# Patient Record
Sex: Female | Born: 1950 | Race: White | Hispanic: No | Marital: Single | Smoking: Never smoker
Health system: Southern US, Community
[De-identification: ages and names within clinical notes are randomized; demographics above are authoritative.]

## PROBLEM LIST (undated history)

## (undated) DIAGNOSIS — I10 Essential (primary) hypertension: Secondary | ICD-10-CM

## (undated) DIAGNOSIS — F419 Anxiety disorder, unspecified: Secondary | ICD-10-CM

## (undated) DIAGNOSIS — F4310 Post-traumatic stress disorder, unspecified: Secondary | ICD-10-CM

## (undated) HISTORY — PX: OVARIAN CYST REMOVAL: SHX89

---

## 2016-03-28 ENCOUNTER — Emergency Department (HOSPITAL_COMMUNITY)
Admission: EM | Admit: 2016-03-28 | Discharge: 2016-03-30 | Disposition: A | Discharge status: Home / Self Care | Payer: BLUE CROSS/BLUE SHIELD | Attending: Emergency Medicine | Admitting: Emergency Medicine

## 2016-03-28 DIAGNOSIS — F419 Anxiety disorder, unspecified: Secondary | ICD-10-CM | POA: Diagnosis not present

## 2016-03-28 DIAGNOSIS — F4323 Adjustment disorder with mixed anxiety and depressed mood: Secondary | ICD-10-CM

## 2016-03-28 HISTORY — DX: Post-traumatic stress disorder, unspecified: F43.10

## 2016-03-28 HISTORY — DX: Anxiety disorder, unspecified: F41.9

## 2016-03-28 NOTE — ED Notes (Addendum)
Pt called EMS because her dog was sick. Pt told EMS that she has a new rx for Seroquel and has not been sleeping. EMS gave 5 mg of haldol and 2.5 pf versed. Pt was extremely worked up prior to medication. Pt is now resting in a bed in triage. Pt denies SI,HI, A/VH Pt is extremely worried about her dog during assessment. Pt is calm and cooperative.

## 2016-03-29 ENCOUNTER — Encounter (HOSPITAL_COMMUNITY): Payer: Self-pay | Admitting: Emergency Medicine

## 2016-03-29 LAB — CBC WITH DIFFERENTIAL/PLATELET
Basophils Absolute: 0 10*3/uL (ref 0.0–0.1)
Basophils Relative: 0 %
Eosinophils Absolute: 0.2 10*3/uL (ref 0.0–0.7)
Eosinophils Relative: 1 %
HCT: 37.5 % (ref 36.0–46.0)
Hemoglobin: 12.5 g/dL (ref 12.0–15.0)
Lymphocytes Relative: 39 %
Lymphs Abs: 4.3 10*3/uL — ABNORMAL HIGH (ref 0.7–4.0)
MCH: 29.6 pg (ref 26.0–34.0)
MCHC: 33.3 g/dL (ref 30.0–36.0)
MCV: 88.7 fL (ref 78.0–100.0)
Monocytes Absolute: 1.1 10*3/uL — ABNORMAL HIGH (ref 0.1–1.0)
Monocytes Relative: 10 %
Neutro Abs: 5.3 10*3/uL (ref 1.7–7.7)
Neutrophils Relative %: 50 %
Platelets: 488 10*3/uL — ABNORMAL HIGH (ref 150–400)
RBC: 4.23 MIL/uL (ref 3.87–5.11)
RDW: 13.5 % (ref 11.5–15.5)
WBC: 10.9 10*3/uL — ABNORMAL HIGH (ref 4.0–10.5)

## 2016-03-29 LAB — URINALYSIS, ROUTINE W REFLEX MICROSCOPIC
BILIRUBIN URINE: NEGATIVE
Glucose, UA: NEGATIVE mg/dL
Hgb urine dipstick: NEGATIVE
KETONES UR: NEGATIVE mg/dL
LEUKOCYTES UA: NEGATIVE
NITRITE: NEGATIVE
PROTEIN: NEGATIVE mg/dL
Specific Gravity, Urine: 1.01 (ref 1.005–1.030)
pH: 7 (ref 5.0–8.0)

## 2016-03-29 LAB — BASIC METABOLIC PANEL
Anion gap: 16 — ABNORMAL HIGH (ref 5–15)
BUN: 15 mg/dL (ref 6–20)
CO2: 28 mmol/L (ref 22–32)
Calcium: 10.6 mg/dL — ABNORMAL HIGH (ref 8.9–10.3)
Chloride: 89 mmol/L — ABNORMAL LOW (ref 101–111)
Creatinine, Ser: 0.81 mg/dL (ref 0.44–1.00)
GFR calc Af Amer: >60 mL/min (ref >60)
GFR calc non Af Amer: >60 mL/min (ref >60)
Glucose, Bld: 100 mg/dL — ABNORMAL HIGH (ref 65–99)
Potassium: 2.7 mmol/L — CL (ref 3.5–5.1)
Sodium: 133 mmol/L — ABNORMAL LOW (ref 135–145)

## 2016-03-29 LAB — TSH: TSH: 2.102 u[IU]/mL (ref 0.350–4.500)

## 2016-03-29 LAB — HEPATIC FUNCTION PANEL
ALT: 13 U/L — ABNORMAL LOW (ref 14–54)
AST: 16 U/L (ref 15–41)
Albumin: 4.3 g/dL (ref 3.5–5.0)
Alkaline Phosphatase: 53 U/L (ref 38–126)
Bilirubin, Direct: <0.1 mg/dL — ABNORMAL LOW (ref 0.1–0.5)
Total Bilirubin: 0.9 mg/dL (ref 0.3–1.2)
Total Protein: 7.4 g/dL (ref 6.5–8.1)

## 2016-03-29 LAB — SALICYLATE LEVEL: Salicylate Lvl: <4 mg/dL (ref 2.8–30.0)

## 2016-03-29 LAB — RAPID URINE DRUG SCREEN, HOSP PERFORMED
Amphetamines: NOT DETECTED
BARBITURATES: NOT DETECTED
Benzodiazepines: POSITIVE — AB
Cocaine: NOT DETECTED
Opiates: NOT DETECTED
TETRAHYDROCANNABINOL: NOT DETECTED

## 2016-03-29 LAB — T4, FREE: Free T4: 1.54 ng/dL — ABNORMAL HIGH (ref 0.61–1.12)

## 2016-03-29 LAB — ETHANOL

## 2016-03-29 LAB — MAGNESIUM: Magnesium: 1.8 mg/dL (ref 1.7–2.4)

## 2016-03-29 LAB — ACETAMINOPHEN LEVEL: Acetaminophen (Tylenol), Serum: <10 ug/mL — ABNORMAL LOW (ref 10–30)

## 2016-03-29 MED ORDER — ONDANSETRON HCL 4 MG PO TABS: 4.0000 mg | ORAL_TABLET | Freq: Three times a day (TID) | ORAL | Status: DC | PRN

## 2016-03-29 MED ORDER — SODIUM CHLORIDE 0.9 % IV BOLUS (SEPSIS)
1000.0000 mL | Freq: Once | INTRAVENOUS | Status: AC
  Administered 2016-03-29: 1000 mL via INTRAVENOUS

## 2016-03-29 MED ORDER — ACETAMINOPHEN 325 MG PO TABS: 650.0000 mg | ORAL_TABLET | ORAL | Status: DC | PRN

## 2016-03-29 MED ORDER — SIMVASTATIN 20 MG PO TABS
20.0000 mg | ORAL_TABLET | Freq: Every day | ORAL | Status: DC
  Administered 2016-03-29: 20 mg via ORAL
  Filled 2016-03-29 (×2): qty 1

## 2016-03-29 MED ORDER — LORAZEPAM 1 MG PO TABS
1.0000 mg | ORAL_TABLET | Freq: Three times a day (TID) | ORAL | Status: DC | PRN
  Administered 2016-03-29 – 2016-03-30 (×2): 1 mg via ORAL
  Filled 2016-03-29 (×2): qty 1

## 2016-03-29 MED ORDER — TRIAMTERENE-HCTZ 37.5-25 MG PO TABS
1.0000 | ORAL_TABLET | Freq: Every day | ORAL | Status: DC
  Administered 2016-03-29 – 2016-03-30 (×2): 1 via ORAL
  Filled 2016-03-29 (×2): qty 1

## 2016-03-29 MED ORDER — POTASSIUM CHLORIDE CRYS ER 20 MEQ PO TBCR
40.0000 meq | EXTENDED_RELEASE_TABLET | Freq: Once | ORAL | Status: DC
Start: 2016-03-29 — End: 2016-03-30

## 2016-03-29 MED ORDER — POTASSIUM CHLORIDE 10 MEQ/100ML IV SOLN
10.0000 meq | INTRAVENOUS | Status: AC
  Administered 2016-03-29 (×4): 10 meq via INTRAVENOUS
  Filled 2016-03-29 (×4): qty 100

## 2016-03-29 MED ORDER — POTASSIUM CHLORIDE CRYS ER 20 MEQ PO TBCR
80.0000 meq | EXTENDED_RELEASE_TABLET | Freq: Once | ORAL | Status: DC
  Filled 2016-03-29: qty 4

## 2016-03-29 MED ORDER — ACETAMINOPHEN 325 MG PO TABS
650.0000 mg | ORAL_TABLET | Freq: Once | ORAL | Status: AC
  Administered 2016-03-29: 650 mg via ORAL
  Filled 2016-03-29: qty 2

## 2016-03-29 MED ORDER — LORAZEPAM 2 MG/ML IJ SOLN: 1.0000 mg | Freq: Once | INTRAMUSCULAR | Status: DC

## 2016-03-29 MED ORDER — ASPIRIN 325 MG PO TABS
325.0000 mg | ORAL_TABLET | Freq: Every day | ORAL | Status: DC
  Administered 2016-03-30: 325 mg via ORAL
  Filled 2016-03-29 (×2): qty 1

## 2016-03-29 NOTE — ED Notes (Signed)
Pt adamant about ability to walk to the bathroom and give urine sample instead of getting catheterized. Pt begged RN to let her go to bathroom. Pt went to bathroom, urinated outside of the hat, adamantly stated she urinated in the hat when questioned. Urine in bowl, none in hat.

## 2016-03-29 NOTE — ED Notes (Signed)
Pt's sister is [name], Phone number is [phone].

## 2016-03-29 NOTE — ED Notes (Signed)
Julie MichaelsLauren McClure RN spoke with pt's sister Julie HamBarbara Curry per pt. Request. Sister sts that pt was taken to urgent care on Saturday because she was "acting unusual." Sister sts that she has been acting "out of sorts for the past year or two, living in and out of hotels and never quite getting a handle on life."  At the urgent care, pt was given a prescription for seroquel and told by physician that she was having a psychotic break and was in a manic state. Told sister to take patient to ER if things got worse. Pt picked up prescription but was unable to take seroquel because she stated she "would just projectile vomit it up." Pt stated this without attempting to take medication. Pt was brought in by EMS this morning after hotel guests called 911 after she was exhibiting bizarre behavior at hotel. Pt continues to maintain that she called 911 herself because her whole body has been shaking uncontrollably. Upon assessment, pt's body only shakes when pt is talking about it shaking. At this time pt is lying awake, calmly sitting in bed.

## 2016-03-29 NOTE — ED Provider Notes (Signed)
CSN: [id number]     Arrival date & time 03/28/16  2336 History  By signing my name below, I, [facility], attest that this documentation has been prepared under the direction and in the presence of [name], MD. Electronically Signed: [name], ED Scribe. 03/29/2016. 2:18 AM.   Chief Complaint  Patient presents with  . Anxiety   The history is provided by the patient. The history is limited by the condition of the patient. No language interpreter was used.    LEVEL V CAVEAT DUE TO ALTERED MENTAL STATUS  HPI Comments: Julie Curry is a 65 y.o. female brought in by EMS with an hx of anxiety and PTSD who presents to the Emergency Department complaining of confusion. Pt states that her dog has been ill recently and that she has felt very anxious about his health.  Patient is unable to provide history secondary to medications administered by EMS.  Past Medical History  Diagnosis Date  . Anxiety   . PTSD (post-traumatic stress disorder)    History reviewed. No pertinent past surgical history. No family history on file. Social History  Substance Use Topics  . Smoking status: Never Smoker   . Smokeless tobacco: None  . Alcohol Use: No   OB History    No data available     Review of Systems  Unable to perform ROS: Mental status change     Allergies  Review of patient's allergies indicates no known allergies.  Home Medications   Prior to Admission medications   Not on File   BP 120/70 mmHg  Pulse 88  Temp(Src) 98.1 F (36.7 C) (Oral)  Resp 20  SpO2 97% Physical Exam  Constitutional: She is oriented to person, place, and time.  Somnolent but arousable, no acute distress  HENT:  Head: Normocephalic and atraumatic.  Eyes: Pupils are equal, round, and reactive to light.  Cardiovascular: Normal rate, regular rhythm and normal heart sounds.   No murmur heard. Pulmonary/Chest: Effort normal and breath sounds normal. No respiratory distress.  She has no wheezes.  Neurological: She is alert and oriented to person, place, and time.  Skin: Skin is warm and dry.  Psychiatric: She has a normal mood and affect.  Nursing note and vitals reviewed.   ED Course  Procedures   DIAGNOSTIC STUDIES: Oxygen Saturation is 97% on RA, normal by my interpretation.    COORDINATION OF CARE: 2:07 AM Discussed treatment plan with pt at bedside and pt agreed to plan.   Labs Review Labs Reviewed - No data to display  Imaging Review No results found. I have personally reviewed and evaluated these images and lab results as part of my medical decision-making.   EKG Interpretation None      MDM   Final diagnoses:  None   It is unclear events surrounding patient's initial presentation. She received multiple doses of sedating medications by EMS and is unable to provide history. She is new to our system. We'll reassess when patient is more awake.  5:56 AM On recheck, patient sleeping comfortably. She is arousable. She no she is in the hospital but is otherwise unable to provide history. She denies physical complaints. She still very somnolent. Will reassess.  6:48 AM Patient is still unable to provide a clear history. She states "I just think something is wrong." She does endorse a toothache and that her dog is sick. She recently moved here and was staying in a hotel last night. She is unclear who called  EMS or why. She does report history of anxiety. Denies any recent medication changes. Unclear what medication she does take. I'm unable to get up with her sister for collateral information. Given patient's age, suspect the medications given by EMS for continuing to make her confused. However, will obtain basic labwork at this point and urine to rule out other etiology given that she is not cleared up.  7:58 AM Discussed with the patient's sister. Patient has recently visited with prospects of potentially moving. However, sister reports  increasingly erratic behavior and strange questions from her sister like "where am I" how did I get here" patient was taken to an urgent care on Saturday by her sister. There was some question by the doctor there whether or not she was in hypomania. No history of mania per the sister. Sister claims that the patient reported "I can't eat, sleep, or walk anymore." She was prescribed Seroquel on Saturday but patient would not take that. The patient's sister also reports that the patient does have a dog that may have been diagnosed with cancer and that was very distressing for the patient.  [name], sister and next of kin  Signed out to Dr. [name]  I personally performed the services described in this documentation, which was scribed in my presence. The recorded information has been reviewed and is accurate.    Shon Baton, MD 03/29/16 0800

## 2016-03-29 NOTE — ED Notes (Signed)
[phone] [id number] Pt's sister

## 2016-03-29 NOTE — ED Notes (Signed)
Pt refusing to use bedpan to give urine sample but maintains that she is unable to walk to bathroom. Pt assisted to bathroom on steady. Pt repeatedly sts "This is terrible, I should've gone on the bedpan. This is terrible." Once pt in bathroom pt sts "I can't do this." When asked why not pt sts "Can't you see my whole body is shaking so bad that I can't pee." Pt's body was not shaking.  Pt asked to sit alone in bathroom for a few minutes while she tried to urinate. Pt given call string and told to call when done or if she needs help.

## 2016-03-29 NOTE — Progress Notes (Addendum)
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  As conversation concluded and CM was leaving out of her room Pt stated she wanted to go to the bathroom Pt reported having difficulty walking Cm consulted ED RN who CM assisted pt to roll to her left side to get on bedpan Once on bedpan pt requested to get OOB to go to bathroom and needing HOB elevated HOB elevated.   Pt does have a sister listed in emergency contact section of demographics Brenda [phone]

## 2016-03-29 NOTE — ED Notes (Signed)
Bed: [id number] Expected date:  Expected time:  Means of arrival:  Comments: RM23
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
## 2016-03-29 NOTE — ED Notes (Addendum)
Pt called out when done in bathroom. Despite repeated instruction not to put toilet paper in urine hat, pt placed soiled paper in urine hat. Pt also sts "I also went number two, the real runny diarrhea stuff I've been suffering with." When this RN assessed stool, it was firm, no diarrhea noted.

## 2016-03-29 NOTE — ED Provider Notes (Signed)
Please see previous physicians note regarding patient's presenting history and physical, initial ED course, and associated medical decision making. In short, this is a 65 year old female brought in by EMS for anxiety. She had received health on Versed by EMS prior to arrival and has been somnolent for the previous provider for the majority of the overnight period. On my evaluation patient is awake and alert and able to answer questions. She has very scattered history, and somewhat of a poor historian. She seems frazzled, with flight of ideas and pressured speech. States that she has recently been in [place] at a hotel to visit her sister, as she is her only family and she is thinking about moving to be close to her family. States that she became extremely distressed yesterday after finding out that her dog was sick overnight. She went to speak with the hotel staff, who apparently called EMS. She denies any suicidal or homicidal thoughts. She does not have visual auditory hallucinations. States that she has had uncontrollable anxiety recently. She is not eating and not drinking. States that she has had significant weight loss in the setting of this. Has not had fever, cough, shortness of breath or chest pain, abdominal pain. Exam overall non-focal exam. UA negative for infection. With hypokalemia 2.7, repeleted with 40 mEq IV KCl as she is refusing oral potassium. Appears dry and given IVF. Felt to be medically cleared, will consult TTS.    Lavera Guiseana Duo Liu, MD 03/29/16 [phone]

## 2016-03-29 NOTE — ED Notes (Signed)
Pt continues to change story about why and how she got here. States that she called EMS because her whole body was shaking. Pt also states she is unable to eat due to nausea. Pt asking what time it is and where she is. Pt reports concern that she is having a heart attack.

## 2016-03-30 DIAGNOSIS — F4323 Adjustment disorder with mixed anxiety and depressed mood: Secondary | ICD-10-CM | POA: Diagnosis not present

## 2016-03-30 MED ORDER — HYDROXYZINE HCL 25 MG PO TABS: 25.0000 mg | ORAL_TABLET | Freq: Four times a day (QID) | ORAL | Status: AC | PRN

## 2016-03-30 MED ORDER — HYDROXYZINE HCL 25 MG PO TABS: 25.0000 mg | ORAL_TABLET | Freq: Four times a day (QID) | ORAL | Status: DC | PRN

## 2016-03-30 NOTE — [facility] Suicide Risk Assessment (Signed)
Suicide Risk Assessment  Discharge Assessment   [facility]   Principal Problem: Adjustment disorder with mixed anxiety and depressed mood Discharge Diagnoses:  Patient Active Problem List   Diagnosis Date Noted  . Adjustment disorder with mixed anxiety and depressed mood [F43.23] 03/30/2016    Priority: High    Total Time spent with patient: 45 minutes    Musculoskeletal: Strength & Muscle Tone: within normal limits Gait & Station: normal Patient leans: N/A  Psychiatric Specialty Exam: Physical Exam  Constitutional: She is oriented to person, place, and time. She appears well-developed and well-nourished.  HENT:  Head: Normocephalic.  Neck: Normal range of motion.  Respiratory: Effort normal.  Musculoskeletal: Normal range of motion.  Neurological: She is alert and oriented to person, place, and time.  Skin: Skin is warm and dry.  Psychiatric: Her speech is normal and behavior is normal. Judgment and thought content normal. Her mood appears anxious. Cognition and memory are normal.    Review of Systems  Constitutional: Negative.   HENT: Negative.   Eyes: Negative.   Respiratory: Negative.   Cardiovascular: Negative.   Gastrointestinal: Negative.   Genitourinary: Negative.   Musculoskeletal: Negative.   Skin: Negative.   Neurological: Negative.   Endo/Heme/Allergies: Negative.   Psychiatric/Behavioral: The patient is nervous/anxious.     Blood pressure 130/76, pulse 67, temperature 97.5 F (36.4 C), temperature source Oral, resp. rate 18, SpO2 97 %.There is no height or weight on file to calculate BMI.  General Appearance: Casual  Eye Contact:  Good  Speech:  Normal Rate  Volume:  Normal  Mood:  Anxious and depression, mild  Affect:  Congruent  Thought Process:  Coherent  Orientation:  Full (Time, Place, and Person)  Thought Content:  WDL  Suicidal Thoughts:  No  Homicidal Thoughts:  No  Memory:  Immediate;   Good Recent;    Good Remote;   Good  Judgement:  Fair  Insight:  Good  Psychomotor Activity:  Normal  Concentration:  Concentration: Good and Attention Span: Good  Recall:  Good  Fund of Knowledge:  Good  Language:  Good  Akathisia:  No  Handed:  Right  AIMS (if indicated):     Assets:  Housing Leisure Time Physical Health Resilience Social Support  ADL's:  Intact  Cognition:  WNL  Sleep:      Mental Status Per Nursing Assessment::   On Admission:   Anxiety and depression  Demographic Factors:  Female and Caucasian  Loss Factors: NA  Historical Factors: NA  Risk Reduction Factors:   Sense of responsibility to family, Positive social support and Positive therapeutic relationship  Continued Clinical Symptoms:  Anxiety, depression mild  Cognitive Features That Contribute To Risk:  None    Suicide Risk:  Minimal: No identifiable suicidal ideation.  Patients presenting with no risk factors but with morbid ruminations; may be classified as minimal risk based on the severity of the depressive symptoms    Plan Of Care/Follow-up recommendations:  Activity:  as tolerated  Diet:  heart healthy diet  Janesha Brissette, NP 03/30/2016, 10:48 AM

## 2016-03-30 NOTE — Consult Note (Addendum)
[place] Psychiatry Consult   Reason for Consult:  Insomnia and anxiety Referring Physician:  EDP Patient Identification: Julie Curry MRN:  [id number] Principal Diagnosis: Adjustment disorder with mixed anxiety and depressed mood Diagnosis:   Patient Active Problem List   Diagnosis Date Noted  . Adjustment disorder with mixed anxiety and depressed mood [F43.23] 03/30/2016    Priority: High    Total Time spent with patient: 45 minutes  Subjective:   Julie Curry is a 65 y.o. female patient does not warrant admission.  HPI:  65 yo female who presented to the ED with anxiety and insomnia.  She is visiting her sister from [place] and reports she should not have come as she is recovering from a virus.  Denies suicidal/homicidal ideations, hallucinations, and alcohol/drug abuse.  Her sister was contacted and she has no safety concerns.  She was given Vistaril and Rx with referral to [facility].  Past Psychiatric History: depression, anxiety  Risk to Self: Suicidal Ideation: No Suicidal Intent: No Is patient at risk for suicide?: No Suicidal Plan?: No Access to Means: No What has been your use of drugs/alcohol within the last 12 months?: Denies How many times?: 0 Other Self Harm Risks: None Triggers for Past Attempts:  (pt denies) Intentional Self Injurious Behavior: None Risk to Others: Homicidal Ideation: No Thoughts of Harm to Others: No Current Homicidal Intent: No Current Homicidal Plan: No Access to Homicidal Means: No Identified Victim: na History of harm to others?: No Assessment of Violence: None Noted Violent Behavior Description: na Does patient have access to weapons?: No Criminal Charges Pending?: No Does patient have a court date: No Prior Inpatient Therapy: Prior Inpatient Therapy: No Prior Therapy Dates: na Prior Therapy Facilty/Provider(s): na Reason for Treatment: na Prior Outpatient Therapy: Prior Outpatient Therapy: No Prior Therapy Dates:  na Prior Therapy Facilty/Provider(s): na Reason for Treatment: na Does patient have an ACCT team?: No Does patient have Intensive In-House Services?  : No Does patient have [facility] services? : No Does patient have [facility] services?: No  Past Medical History:  Past Medical History  Diagnosis Date  . Anxiety   . PTSD (post-traumatic stress disorder)    History reviewed. No pertinent past surgical history. Family History: No family history on file. Family Psychiatric  History: none Social History:  History  Alcohol Use No     History  Drug Use Not on file    Social History   Social History  . Marital Status: Single    Spouse Name: N/A  . Number of Children: N/A  . Years of Education: N/A   Social History Main Topics  . Smoking status: Never Smoker   . Smokeless tobacco: None  . Alcohol Use: No  . Drug Use: None  . Sexual Activity: Not Asked   Other Topics Concern  . None   Social History Narrative  . None   Additional Social History:    Allergies:  No Known Allergies  Labs:  Results for orders placed or performed during the hospital encounter of 03/28/16 (from the past 48 hour(s))  CBC with Differential     Status: Abnormal   Collection Time: 03/29/16  7:12 AM  Result Value Ref Range   WBC 10.9 (H) 4.0 - 10.5 K/uL   RBC 4.23 3.87 - 5.11 MIL/uL   Hemoglobin 12.5 12.0 - 15.0 g/dL   HCT 37.5 36.0 - 46.0 %   MCV 88.7 78.0 - 100.0 fL   MCH 29.6 26.0 - 34.0 pg  MCHC 33.3 30.0 - 36.0 g/dL   RDW 13.5 11.5 - 15.5 %   Platelets 488 (H) 150 - 400 K/uL   Neutrophils Relative % 50 %   Neutro Abs 5.3 1.7 - 7.7 K/uL   Lymphocytes Relative 39 %   Lymphs Abs 4.3 (H) 0.7 - 4.0 K/uL   Monocytes Relative 10 %   Monocytes Absolute 1.1 (H) 0.1 - 1.0 K/uL   Eosinophils Relative 1 %   Eosinophils Absolute 0.2 0.0 - 0.7 K/uL   Basophils Relative 0 %   Basophils Absolute 0.0 0.0 - 0.1 K/uL  Basic metabolic panel     Status: Abnormal   Collection Time: 03/29/16  7:12 AM   Result Value Ref Range   Sodium 133 (L) 135 - 145 mmol/L   Potassium 2.7 (LL) 3.5 - 5.1 mmol/L    Comment: CRITICAL RESULT CALLED TO, READ BACK BY AND VERIFIED WITH: NEACE,A RN 03/29/16 @0814  ZANDO,C    Chloride 89 (L) 101 - 111 mmol/L   CO2 28 22 - 32 mmol/L   Glucose, Bld 100 (H) 65 - 99 mg/dL   BUN 15 6 - 20 mg/dL   Creatinine, Ser 0.81 0.44 - 1.00 mg/dL   Calcium 10.6 (H) 8.9 - 10.3 mg/dL   GFR calc non Af Amer >60 >60 mL/min   GFR calc Af Amer >60 >60 mL/min    Comment: (NOTE) The eGFR has been calculated using the CKD EPI equation. This calculation has not been validated in all clinical situations. eGFR's persistently <60 mL/min signify possible Chronic Kidney Disease.    Anion gap 16 (H) 5 - 15  Magnesium     Status: None   Collection Time: 03/29/16  7:12 AM  Result Value Ref Range   Magnesium 1.8 1.7 - 2.4 mg/dL  Hepatic function panel     Status: Abnormal   Collection Time: 03/29/16  8:06 AM  Result Value Ref Range   Total Protein 7.4 6.5 - 8.1 g/dL   Albumin 4.3 3.5 - 5.0 g/dL   AST 16 15 - 41 U/L   ALT 13 (L) 14 - 54 U/L   Alkaline Phosphatase 53 38 - 126 U/L   Total Bilirubin 0.9 0.3 - 1.2 mg/dL   Bilirubin, Direct <0.1 (L) 0.1 - 0.5 mg/dL   Indirect Bilirubin NOT CALCULATED 0.3 - 0.9 mg/dL  TSH     Status: None   Collection Time: 03/29/16  8:06 AM  Result Value Ref Range   TSH 2.102 0.350 - 4.500 uIU/mL  T4, free     Status: Abnormal   Collection Time: 03/29/16  8:06 AM  Result Value Ref Range   Free T4 1.54 (H) 0.61 - 1.12 ng/dL    Comment: Performed at [facility]  Ethanol     Status: None   Collection Time: 03/29/16  8:06 AM  Result Value Ref Range   Alcohol, Ethyl (B) <5 <5 mg/dL    Comment:        LOWEST DETECTABLE LIMIT FOR SERUM ALCOHOL IS 5 mg/dL FOR MEDICAL PURPOSES ONLY   Salicylate level     Status: None   Collection Time: 03/29/16  8:06 AM  Result Value Ref Range   Salicylate Lvl <3.8 2.8 - 30.0 mg/dL  Acetaminophen level      Status: Abnormal   Collection Time: 03/29/16  8:06 AM  Result Value Ref Range   Acetaminophen (Tylenol), Serum <10 (L) 10 - 30 ug/mL    Comment:  THERAPEUTIC CONCENTRATIONS VARY SIGNIFICANTLY. A RANGE OF 10-30 ug/mL MAY BE AN EFFECTIVE CONCENTRATION FOR MANY PATIENTS. HOWEVER, SOME ARE BEST TREATED AT CONCENTRATIONS OUTSIDE THIS RANGE. ACETAMINOPHEN CONCENTRATIONS >150 ug/mL AT 4 HOURS AFTER INGESTION AND >50 ug/mL AT 12 HOURS AFTER INGESTION ARE OFTEN ASSOCIATED WITH TOXIC REACTIONS.   Urine rapid drug screen (hosp performed)     Status: Abnormal   Collection Time: 03/29/16  3:22 PM  Result Value Ref Range   Opiates NONE DETECTED NONE DETECTED   Cocaine NONE DETECTED NONE DETECTED   Benzodiazepines POSITIVE (A) NONE DETECTED   Amphetamines NONE DETECTED NONE DETECTED   Tetrahydrocannabinol NONE DETECTED NONE DETECTED   Barbiturates NONE DETECTED NONE DETECTED    Comment:        DRUG SCREEN FOR MEDICAL PURPOSES ONLY.  IF CONFIRMATION IS NEEDED FOR ANY PURPOSE, NOTIFY LAB WITHIN 5 DAYS.        LOWEST DETECTABLE LIMITS FOR URINE DRUG SCREEN Drug Class       Cutoff (ng/mL) Amphetamine      1000 Barbiturate      200 Benzodiazepine   185 Tricyclics       631 Opiates          300 Cocaine          300 THC              50   Urinalysis, Routine w reflex microscopic (not at [facility])     Status: None   Collection Time: 03/29/16  3:22 PM  Result Value Ref Range   Color, Urine YELLOW YELLOW   APPearance CLEAR CLEAR   Specific Gravity, Urine 1.010 1.005 - 1.030   pH 7.0 5.0 - 8.0   Glucose, UA NEGATIVE NEGATIVE mg/dL   Hgb urine dipstick NEGATIVE NEGATIVE   Bilirubin Urine NEGATIVE NEGATIVE   Ketones, ur NEGATIVE NEGATIVE mg/dL   Protein, ur NEGATIVE NEGATIVE mg/dL   Nitrite NEGATIVE NEGATIVE   Leukocytes, UA NEGATIVE NEGATIVE    Comment: MICROSCOPIC NOT DONE ON URINES WITH NEGATIVE PROTEIN, BLOOD, LEUKOCYTES, NITRITE, OR GLUCOSE <1000 mg/dL.    Current  Facility-Administered Medications  Medication Dose Route Frequency Provider Last Rate Last Dose  . acetaminophen (TYLENOL) tablet 650 mg  650 mg Oral Q4H PRN Forde Dandy, MD      . aspirin tablet 325 mg  325 mg Oral Daily Forde Dandy, MD   325 mg at 03/30/16 0956  . LORazepam (ATIVAN) tablet 1 mg  1 mg Oral Q8H PRN Forde Dandy, MD   1 mg at 03/29/16 1915  . ondansetron (ZOFRAN) tablet 4 mg  4 mg Oral Q8H PRN Forde Dandy, MD      . potassium chloride SA (K-DUR,KLOR-CON) CR tablet 40 mEq  40 mEq Oral Once Forde Dandy, MD   40 mEq at 03/29/16 1522  . simvastatin (ZOCOR) tablet 20 mg  20 mg Oral q1800 Forde Dandy, MD   20 mg at 03/29/16 1833  . triamterene-hydrochlorothiazide (MAXZIDE-25) 37.5-25 MG per tablet 1 tablet  1 tablet Oral Daily Forde Dandy, MD   1 tablet at 03/30/16 4970   Current Outpatient Prescriptions  Medication Sig Dispense Refill  . aspirin 325 MG tablet Take 325 mg by mouth daily.    . QUEtiapine Fumarate (SEROQUEL PO) Take 1 tablet by mouth as needed (sleep/anxiety).    . simvastatin (ZOCOR) 20 MG tablet Take 20 mg by mouth daily.    Marland Kitchen triamterene-hydrochlorothiazide (MAXZIDE-25) 37.5-25 MG tablet Take 1  tablet by mouth daily.      Musculoskeletal: Strength & Muscle Tone: within normal limits Gait & Station: normal Patient leans: N/A  Psychiatric Specialty Exam: Physical Exam  Constitutional: She is oriented to person, place, and time. She appears well-developed and well-nourished.  HENT:  Head: Normocephalic.  Neck: Normal range of motion.  Respiratory: Effort normal.  Musculoskeletal: Normal range of motion.  Neurological: She is alert and oriented to person, place, and time.  Skin: Skin is warm and dry.  Psychiatric: Her speech is normal and behavior is normal. Judgment and thought content normal. Her mood appears anxious. Cognition and memory are normal.    Review of Systems  Constitutional: Negative.   HENT: Negative.   Eyes: Negative.    Respiratory: Negative.   Cardiovascular: Negative.   Gastrointestinal: Negative.   Genitourinary: Negative.   Musculoskeletal: Negative.   Skin: Negative.   Neurological: Negative.   Endo/Heme/Allergies: Negative.   Psychiatric/Behavioral: The patient is nervous/anxious.     Blood pressure 130/76, pulse 67, temperature 97.5 F (36.4 C), temperature source Oral, resp. rate 18, SpO2 97 %.There is no height or weight on file to calculate BMI.  General Appearance: Casual  Eye Contact:  Good  Speech:  Normal Rate  Volume:  Normal  Mood:  Anxious and depression, mild  Affect:  Congruent  Thought Process:  Coherent  Orientation:  Full (Time, Place, and Person)  Thought Content:  WDL  Suicidal Thoughts:  No  Homicidal Thoughts:  No  Memory:  Immediate;   Good Recent;   Good Remote;   Good  Judgement:  Fair  Insight:  Good  Psychomotor Activity:  Normal  Concentration:  Concentration: Good and Attention Span: Good  Recall:  Good  Fund of Knowledge:  Good  Language:  Good  Akathisia:  No  Handed:  Right  AIMS (if indicated):     Assets:  Housing Leisure Time Physical Health Resilience Social Support  ADL's:  Intact  Cognition:  WNL  Sleep:        Treatment Plan Summary: Daily contact with patient to assess and evaluate symptoms and progress in treatment, Medication management and Plan adjustment disorder with mixed anxiety and depressed mood:  -Crisis stabilization -Medication management:  Start Vistaril 25 mg every six hours PRN anxiety -Individual counseling  -Rx for Vistaril and referral to [facility]  Disposition: No evidence of imminent risk to self or others at present.    Waylan Boga, NP 03/30/2016 10:09 AM Patient seen face-to-face for psychiatric evaluation, chart reviewed and case discussed with the physician extender and developed treatment plan. Reviewed the information documented and agree with the treatment plan. Corena Pilgrim, MD

## 2016-03-30 NOTE — ED Notes (Signed)
Patient discharged to home with sister with resources.  All of her belongings were returned.  Patient left the unit ambulatory with her sister.  She is denying any thoughts of harm to self or others at this time.

## 2016-03-30 NOTE — BH Assessment (Signed)
[facility] Assessment Progress Note  Per Thedore MinsMojeed Akintayo, MD, this pt does not require psychiatric hospitalization at this time.  [facility] information.  Discharge instructions advise pt to follow up with [facility].  Pt's nurse, Diane, has been notified.  Julie Canninghomas Tavien Chestnut, MA Triage Specialist [phone]

## 2016-03-30 NOTE — Discharge Instructions (Signed)
For your ongoing mental health needs, you are advised to follow up with [facility].  New and returning patients are seen at their walk-in clinic.  Walk-in hours are Monday - Friday from 8:00 am - 3:00 pm.  Walk-in patients are seen on a first come, first served basis.  Try to arrive as early as possible for he best chance of being seen the same day: ° °     [facility] °     [address] °     [place], [place] [address] °     [phone] °

## 2016-03-30 NOTE — ED Notes (Signed)
Patient denies SI, HI and AVH at this time. Patient reports increased depression and anxiety. Patient has family at bedside. Plan of care discussed. Patient voices no complaints or concerns at this time. Encouragement and support provided and safety maintain. Q 15 min safety checks remain in place.

## 2016-03-31 ENCOUNTER — Emergency Department (HOSPITAL_COMMUNITY)
Admission: EM | Admit: 2016-03-31 | Discharge: 2016-04-06 | Disposition: A | Discharge status: Other Acute Inpatient Hospital | Payer: BLUE CROSS/BLUE SHIELD | Attending: Emergency Medicine | Admitting: Emergency Medicine

## 2016-03-31 ENCOUNTER — Encounter (HOSPITAL_COMMUNITY): Payer: Self-pay | Admitting: Emergency Medicine

## 2016-03-31 DIAGNOSIS — Z7982 Long term (current) use of aspirin: Secondary | ICD-10-CM | POA: Insufficient documentation

## 2016-03-31 DIAGNOSIS — I10 Essential (primary) hypertension: Secondary | ICD-10-CM | POA: Diagnosis not present

## 2016-03-31 DIAGNOSIS — F419 Anxiety disorder, unspecified: Secondary | ICD-10-CM

## 2016-03-31 DIAGNOSIS — F418 Other specified anxiety disorders: Secondary | ICD-10-CM | POA: Insufficient documentation

## 2016-03-31 DIAGNOSIS — Z79899 Other long term (current) drug therapy: Secondary | ICD-10-CM | POA: Insufficient documentation

## 2016-03-31 DIAGNOSIS — F333 Major depressive disorder, recurrent, severe with psychotic symptoms: Secondary | ICD-10-CM | POA: Diagnosis not present

## 2016-03-31 DIAGNOSIS — F32A Depression, unspecified: Secondary | ICD-10-CM

## 2016-03-31 DIAGNOSIS — F329 Major depressive disorder, single episode, unspecified: Secondary | ICD-10-CM

## 2016-03-31 DIAGNOSIS — Z008 Encounter for other general examination: Secondary | ICD-10-CM | POA: Diagnosis present

## 2016-03-31 HISTORY — DX: Essential (primary) hypertension: I10

## 2016-03-31 LAB — COMPREHENSIVE METABOLIC PANEL
ALT: 15 U/L (ref 14–54)
AST: 19 U/L (ref 15–41)
Albumin: 4 g/dL (ref 3.5–5.0)
Alkaline Phosphatase: 57 U/L (ref 38–126)
Anion gap: 13 (ref 5–15)
BUN: 8 mg/dL (ref 6–20)
CHLORIDE: 100 mmol/L — AB (ref 101–111)
CO2: 23 mmol/L (ref 22–32)
Calcium: 10 mg/dL (ref 8.9–10.3)
Creatinine, Ser: 0.75 mg/dL (ref 0.44–1.00)
GFR calc Af Amer: >60 mL/min (ref >60)
GLUCOSE: 98 mg/dL (ref 65–99)
POTASSIUM: 3.4 mmol/L — AB (ref 3.5–5.1)
Sodium: 136 mmol/L (ref 135–145)
Total Bilirubin: 0.3 mg/dL (ref 0.3–1.2)
Total Protein: 7.5 g/dL (ref 6.5–8.1)

## 2016-03-31 LAB — CBC
HEMATOCRIT: 41.9 % (ref 36.0–46.0)
Hemoglobin: 13.6 g/dL (ref 12.0–15.0)
MCH: 29.4 pg (ref 26.0–34.0)
MCHC: 32.5 g/dL (ref 30.0–36.0)
MCV: 90.7 fL (ref 78.0–100.0)
PLATELETS: 539 10*3/uL — AB (ref 150–400)
RBC: 4.62 MIL/uL (ref 3.87–5.11)
RDW: 13.2 % (ref 11.5–15.5)
WBC: 8.9 10*3/uL (ref 4.0–10.5)

## 2016-03-31 LAB — SALICYLATE LEVEL: Salicylate Lvl: <4 mg/dL (ref 2.8–30.0)

## 2016-03-31 LAB — RAPID URINE DRUG SCREEN, HOSP PERFORMED
AMPHETAMINES: NOT DETECTED
BENZODIAZEPINES: POSITIVE — AB
Barbiturates: NOT DETECTED
Cocaine: NOT DETECTED
Opiates: NOT DETECTED
Tetrahydrocannabinol: NOT DETECTED

## 2016-03-31 LAB — ACETAMINOPHEN LEVEL: Acetaminophen (Tylenol), Serum: <10 ug/mL — ABNORMAL LOW (ref 10–30)

## 2016-03-31 LAB — ETHANOL: Alcohol, Ethyl (B): <5 mg/dL (ref <5)

## 2016-03-31 MED ORDER — POTASSIUM CHLORIDE CRYS ER 20 MEQ PO TBCR
20.0000 meq | EXTENDED_RELEASE_TABLET | Freq: Once | ORAL | Status: AC
  Administered 2016-03-31: 20 meq via ORAL
  Filled 2016-03-31: qty 1

## 2016-03-31 MED ORDER — HYDROXYZINE HCL 25 MG PO TABS
25.0000 mg | ORAL_TABLET | Freq: Four times a day (QID) | ORAL | Status: DC | PRN
  Administered 2016-03-31 – 2016-04-06 (×10): 25 mg via ORAL
  Filled 2016-03-31 (×10): qty 1

## 2016-03-31 NOTE — ED Provider Notes (Signed)
CSN: [id number]     Arrival date & time 03/31/16  1529 History   First MD Initiated Contact with Patient 03/31/16 2005     Chief Complaint  Patient presents with  . Psychiatric Evaluation     (Consider location/radiation/quality/duration/timing/severity/associated sxs/prior Treatment) HPI Comments: This is 65 year old female who presents numerous vague complaints, stating that her body has been out of control since she had presumed food poisoning about a month ago while she was in [place].  She was evaluated but never followed through with stool samples.  She states at that time she had vomiting and diarrhea for 3 or 4 days.  Since that time.  Her stools have not returned to normal.  They look thin and small amounts past frequently.  She is urinating normally.  She also states that she had 2 teeth break and she had an appointment set up by her sister with a dentist but she tip felt too bad to make the appointment. She states she has decreased appetite.  She also states that her toes "curled up."  After this bout of diarrhea and have not returned to normal.  She was recently seen within the system on June 5, and [facility] for anxiety and depression.  She was discharged home to her sister 1 days ago , but was told by her brother-in-law.  She was not welcome in the home.  He made no attempt to follow-up with [facility].  The history is provided by the patient.    Past Medical History  Diagnosis Date  . Anxiety   . PTSD (post-traumatic stress disorder)   . Hypertension    Past Surgical History  Procedure Laterality Date  . Ovarian cyst removal     History reviewed. No pertinent family history. Social History  Substance Use Topics  . Smoking status: Never Smoker   . Smokeless tobacco: None  . Alcohol Use: No   OB History    No data available     Review of Systems  Constitutional: Positive for appetite change.  Respiratory: Negative for shortness of breath.     Cardiovascular: Negative for chest pain and leg swelling.  Gastrointestinal: Positive for diarrhea. Negative for nausea and vomiting.  Musculoskeletal: Negative for myalgias and arthralgias.  Neurological: Negative for dizziness and headaches.  Psychiatric/Behavioral: The patient is nervous/anxious.   All other systems reviewed and are negative.     Allergies  Lasix and Tape  Home Medications   Prior to Admission medications   Medication Sig Start Date End Date Taking? Authorizing Provider  aspirin 325 MG tablet Take 325 mg by mouth daily.   Yes Historical Provider, MD  cetirizine (ZYRTEC) 10 MG tablet Take 10 mg by mouth daily as needed for allergies.   Yes Historical Provider, MD  diphenhydrAMINE (BENADRYL) 25 mg capsule Take 25 mg by mouth every 6 (six) hours as needed for allergies (or anxiety).   Yes Historical Provider, MD  hydrOXYzine (ATARAX/VISTARIL) 25 MG tablet Take 1 tablet (25 mg total) by mouth every 6 (six) hours as needed for anxiety. 03/30/16  Yes Charm Rings, NP  clonazePAM (KLONOPIN) 1 MG tablet Take 1 mg by mouth 4 (four) times daily.    Historical Provider, MD  montelukast (SINGULAIR) 10 MG tablet Take 10 mg by mouth at bedtime.    Historical Provider, MD  potassium chloride (K-DUR,KLOR-CON) 10 MEQ tablet Take 10 mEq by mouth every 12 (twelve) hours.    Historical Provider, MD  QUEtiapine (SEROQUEL) 50  MG tablet Take 50 mg by mouth See admin instructions. Take 50 mg by mouth twice daily for one day on 03/27/16 then 100 mg twice daily on 03/28/16 then 100 mg three times daily thereafter    Historical Provider, MD  risperiDONE (RISPERDAL) 1 MG tablet Take 1 mg by mouth at bedtime.    Historical Provider, MD  simvastatin (ZOCOR) 20 MG tablet Take 20 mg by mouth daily.    Historical Provider, MD  triamterene-hydrochlorothiazide (MAXZIDE-25) 37.5-25 MG tablet Take 1 tablet by mouth daily.    Historical Provider, MD   BP 120/65 mmHg  Pulse 93  Temp(Src) 98.7 F (37.1  C) (Oral)  Resp 18  Ht 5\' 3"  (1.6 m)  Wt 57.607 kg  BMI 22.50 kg/m2  SpO2 97% Physical Exam  Constitutional: She appears well-developed and well-nourished.  HENT:  Head: Normocephalic.  Eyes: Pupils are equal, round, and reactive to light.  Neck: Normal range of motion.  Cardiovascular: Normal rate and regular rhythm.   Pulmonary/Chest: Effort normal and breath sounds normal.  Abdominal: Soft. Bowel sounds are normal.  Musculoskeletal: She exhibits no edema or tenderness.       Feet:  Neurological: She is alert.  Skin: Skin is warm.  Psychiatric: Her speech is normal and behavior is normal. Her mood appears anxious. She expresses inappropriate judgment.  Nursing note and vitals reviewed.   ED Course  Procedures (including critical care time) Labs Review Labs Reviewed  COMPREHENSIVE METABOLIC PANEL - Abnormal; Notable for the following:    Potassium 3.4 (*)    Chloride 100 (*)    All other components within normal limits  ACETAMINOPHEN LEVEL - Abnormal; Notable for the following:    Acetaminophen (Tylenol), Serum <10 (*)    All other components within normal limits  CBC - Abnormal; Notable for the following:    Platelets 539 (*)    All other components within normal limits  URINE RAPID DRUG SCREEN, HOSP PERFORMED - Abnormal; Notable for the following:    Benzodiazepines POSITIVE (*)    All other components within normal limits  ETHANOL  SALICYLATE LEVEL    Imaging Review No results found. I have personally reviewed and evaluated these images and lab results as part of my medical decision-making.   EKG Interpretation None       Patient has been assessed by TTS.  She is waiting evaluation by psychiatry in the morning MDM   Final diagnoses:  Depression  Anxiety         Earley FavorGail Lorcan Shelp, NP 04/01/16 0215  Earley FavorGail Younique Casad, NP 04/01/16 [id number]  Rolan BuccoMelanie Belfi, MD 04/10/16 [phone]

## 2016-03-31 NOTE — ED Notes (Signed)
Pt states "I am ready to go-- if [facility] wants to take me that is ok. I am 65 yrs old"

## 2016-03-31 NOTE — ED Notes (Signed)
Pt called out stating that she really needs something for her anxiety. Will inform primary RN

## 2016-03-31 NOTE — Progress Notes (Signed)
This writer completed a chart review.   Montey Ebel, MSW, LCSW, LCAS [facility] Triage Specialist [phone] 

## 2016-03-31 NOTE — ED Notes (Signed)
Pt drove self here from [place],

## 2016-03-31 NOTE — ED Notes (Addendum)
Pt c/o "my body is out of control" pt [facility], [facility]-- discharged yesterday-- went to sister[place] house [place]-- was told by brother in law that she could not stay there. Has been living in hotels in [place] and just came here a few daays ago. Pt states she is unable to take care of herself. Pt states that she has a master's degree in counseling.  Pt states that these symptoms all started a few years ago after having food poisoning, "my body is ravaged-- my toes are not the same-- like something jumped into my body"   Pt states that she has been on bp meds, and antianxiety meds but has moved here from [place] and doesn't have any meds.

## 2016-04-01 DIAGNOSIS — F32A Depression, unspecified: Secondary | ICD-10-CM | POA: Insufficient documentation

## 2016-04-01 DIAGNOSIS — F329 Major depressive disorder, single episode, unspecified: Secondary | ICD-10-CM | POA: Insufficient documentation

## 2016-04-01 DIAGNOSIS — F419 Anxiety disorder, unspecified: Secondary | ICD-10-CM | POA: Insufficient documentation

## 2016-04-01 NOTE — ED Notes (Signed)
Patient states she is starting to feel anxious again.

## 2016-04-01 NOTE — ED Notes (Signed)
[name]

## 2016-04-01 NOTE — ED Notes (Signed)
Patient is resting comfortably. 

## 2016-04-01 NOTE — Progress Notes (Signed)
CSW engaged with Patient's sister and Patient's brother in law outside of Patient's room at family request. Patient's family reports concern for disposition for Patient when ready for discharge. Patient's family reports wanting a medical work up completed on Patient for physical health concerns. CSW explained that Patient has been medically cleared at this time but would let Patient's RN know there concerns. CSW also explained that Patient is to be reassessed by TTS to further determine if inpatient psych is appropriate or if it is felt that Patient is pyschiatrically cleared. CSW explained that if inpatient psych tx is recommended, Patient will be referred to psych facilties for further treatment. If Patient is psychiatrically cleared and having been cleared medically, Patient will be discharged and cannot remain in the emergency department for Assisted Living Facility placement. Patient's family is reporting that Patient cannot remain living with them as they "cannot handle her and give her the care she needs". Her sister reports that they cannot put Patient in a hotel as she cannot walk.  Patient's family reports that ideally they would like for Patient to go to an inpatient psychiatric facility.   CSW provided Patient's family with a list of assisted living facilities in [place] area as Patient's sister reports that she would want her sister to be close to her home. CSW also provided Patient's family with information for applying for Long Term Medicaid which is required for ALF placement unless family plans to pay privately. CSW explained that with Patient being a resident of [place], it may be a challenge obtaining [place] Medicaid in [place]. CSW also stressed that Assisted Living Placement would not happen over night, and Patient may be discharged prior to ALF placement is found and Patient cannot remain in the ED of medically and psychiatrically cleared. Patient's family reports that  they are willing to temporarily pay privately. CSW emphasized that Patient is currently her own legal guardian and that if she does not agree to go to an ALF, her family cannot force her to go. CSW has requested a PT consult to determine Patient's mobility level and recommendation of care as Patient's sister continues to report that Patient cannot walk. CSW will continue to follow.          Lance MussAshley Gardner,MSW, LCSW [facility] ED/76[facility] Clinical Social Worker [phone]

## 2016-04-01 NOTE — ED Notes (Signed)
tts at bedside 

## 2016-04-01 NOTE — ED Notes (Signed)
Patient was given a cup of water. A regular diet ordered for lunch.

## 2016-04-01 NOTE — ED Notes (Signed)
TTS in progress at this time.  

## 2016-04-01 NOTE — ED Notes (Signed)
Pt reports feeling anxious, is calm. Offered atarax prn order and would accept. Is concerned about her toenail fungus.

## 2016-04-01 NOTE — Consult Note (Signed)
Telepsych Consultation   Reason for Consult: Worsening symptoms of anxiety  Referring Physician: EDP  Patient Identification: Julie Curry  MRN:  [id number]  Principal Diagnosis: <principal problem not specified>  Diagnosis:   Patient Active Problem List   Diagnosis Date Noted  . Adjustment disorder with mixed anxiety and depressed mood [F43.23] 03/30/2016   Total Time spent with patient: 45 minutes  Subjective:   Julie Curry is a 65 y.o. female patient admitted with complaints of severe anxiety levels, tremors & disorientation.Marland Kitchen  HPI: Julie Curry is a 65 year old Caucasian female with hx of generalized anxiety disorder. Report indicated that patient came to visit sister in [place] from [place] area. She was brought to the ED with complaints of worsening symptoms of anxiety. During this assessment via telepsych, she reports, "I came to the hospital to get some help. I have anxiety. It has been going on for a long time. I have been treated with Xanax, klonopin & Valium in the past. The doctors decided to take me off of them. I think I need something for this anxiety. I don't want inpatient hospitalization. I just need Klonopin". Collateral information from sister via telephone indicated that patient's health has been on the decline since arriving to [facility] from [place] about 4 weeks ago visiting sister. Sister states that patient has been presenting with increased confusion, disorientation, tremors & generalized weakness. She states that Julie Curry has difficulties walking. She wants inpatient hospitalization for patient.  Past Psychiatric History: Generalized anxiety disorder  Risk to Self: Suicidal Ideation: No (denies) Suicidal Intent: No (denies) Is patient at risk for suicide?: No Suicidal Plan?: No Access to Means: No (denies) What has been your use of drugs/alcohol within the last 12 months?: none How many times?: 0 Other Self Harm Risks: none noted Triggers for Past Attempts:   (na) Intentional Self Injurious Behavior: None Risk to Others: Homicidal Ideation: No (denies) Thoughts of Harm to Others: No (denies) Current Homicidal Intent: No Current Homicidal Plan: No Access to Homicidal Means: No Identified Victim: na History of harm to others?: No (denies) Assessment of Violence: None Noted Violent Behavior Description: na Does patient have access to weapons?: No Criminal Charges Pending?: No (denies) Does patient have a court date: No Prior Inpatient Therapy: Prior Inpatient Therapy: Yes Prior Therapy Dates: 6/3-03/30/16 Prior Therapy Facilty/Provider(s): [facility] Reason for Treatment: Depression Prior Outpatient Therapy: Prior Outpatient Therapy: No Prior Therapy Dates: na Prior Therapy Facilty/Provider(s): na Reason for Treatment: na Does patient have an ACCT team?: No Does patient have Intensive In-House Services?  : No Does patient have [facility] services? : No Does patient have P4CC services?: No  Past Medical History:  Past Medical History  Diagnosis Date  . Anxiety   . PTSD (post-traumatic stress disorder)   . Hypertension     Past Surgical History  Procedure Laterality Date  . Ovarian cyst removal     Family History: History reviewed. No pertinent family history.  Family Psychiatric  History: Depression: Sister  Social History:  History  Alcohol Use No     History  Drug Use Not on file    Social History   Social History  . Marital Status: Single    Spouse Name: N/A  . Number of Children: N/A  . Years of Education: N/A   Social History Main Topics  . Smoking status: Never Smoker   . Smokeless tobacco: None  . Alcohol Use: No  . Drug Use: None  . Sexual Activity: Not Asked  Other Topics Concern  . None   Social History Narrative   Additional Social History:  Allergies:   Allergies  Allergen Reactions  . Lasix [Furosemide] Other (See Comments)    STATES THAT IT DEPLETES TOO MUCH OF HER POTASSIUM   . Tape Rash    Labs:  Results for orders placed or performed during the hospital encounter of 03/31/16 (from the past 48 hour(s))  Comprehensive metabolic panel     Status: Abnormal   Collection Time: 03/31/16  3:53 PM  Result Value Ref Range   Sodium 136 135 - 145 mmol/L   Potassium 3.4 (L) 3.5 - 5.1 mmol/L   Chloride 100 (L) 101 - 111 mmol/L   CO2 23 22 - 32 mmol/L   Glucose, Bld 98 65 - 99 mg/dL   BUN 8 6 - 20 mg/dL   Creatinine, Ser 0.75 0.44 - 1.00 mg/dL   Calcium 10.0 8.9 - 10.3 mg/dL   Total Protein 7.5 6.5 - 8.1 g/dL   Albumin 4.0 3.5 - 5.0 g/dL   AST 19 15 - 41 U/L   ALT 15 14 - 54 U/L   Alkaline Phosphatase 57 38 - 126 U/L   Total Bilirubin 0.3 0.3 - 1.2 mg/dL   GFR calc non Af Amer >60 >60 mL/min   GFR calc Af Amer >60 >60 mL/min    Comment: (NOTE) The eGFR has been calculated using the CKD EPI equation. This calculation has not been validated in all clinical situations. eGFR's persistently <60 mL/min signify possible Chronic Kidney Disease.    Anion gap 13 5 - 15  Ethanol     Status: None   Collection Time: 03/31/16  3:53 PM  Result Value Ref Range   Alcohol, Ethyl (B) <5 <5 mg/dL    Comment:        LOWEST DETECTABLE LIMIT FOR SERUM ALCOHOL IS 5 mg/dL FOR MEDICAL PURPOSES ONLY   Salicylate level     Status: None   Collection Time: 03/31/16  3:53 PM  Result Value Ref Range   Salicylate Lvl <9.6 2.8 - 30.0 mg/dL  Acetaminophen level     Status: Abnormal   Collection Time: 03/31/16  3:53 PM  Result Value Ref Range   Acetaminophen (Tylenol), Serum <10 (L) 10 - 30 ug/mL    Comment:        THERAPEUTIC CONCENTRATIONS VARY SIGNIFICANTLY. A RANGE OF 10-30 ug/mL MAY BE AN EFFECTIVE CONCENTRATION FOR MANY PATIENTS. HOWEVER, SOME ARE BEST TREATED AT CONCENTRATIONS OUTSIDE THIS RANGE. ACETAMINOPHEN CONCENTRATIONS >150 ug/mL AT 4 HOURS AFTER INGESTION AND >50 ug/mL AT 12 HOURS AFTER INGESTION ARE OFTEN ASSOCIATED WITH TOXIC REACTIONS.   cbc     Status: Abnormal    Collection Time: 03/31/16  3:53 PM  Result Value Ref Range   WBC 8.9 4.0 - 10.5 K/uL   RBC 4.62 3.87 - 5.11 MIL/uL   Hemoglobin 13.6 12.0 - 15.0 g/dL   HCT 41.9 36.0 - 46.0 %   MCV 90.7 78.0 - 100.0 fL   MCH 29.4 26.0 - 34.0 pg   MCHC 32.5 30.0 - 36.0 g/dL   RDW 13.2 11.5 - 15.5 %   Platelets 539 (H) 150 - 400 K/uL  Rapid urine drug screen (hospital performed)     Status: Abnormal   Collection Time: 03/31/16  8:39 PM  Result Value Ref Range   Opiates NONE DETECTED NONE DETECTED   Cocaine NONE DETECTED NONE DETECTED   Benzodiazepines POSITIVE (A) NONE DETECTED   Amphetamines NONE  DETECTED NONE DETECTED   Tetrahydrocannabinol NONE DETECTED NONE DETECTED   Barbiturates NONE DETECTED NONE DETECTED    Comment:        DRUG SCREEN FOR MEDICAL PURPOSES ONLY.  IF CONFIRMATION IS NEEDED FOR ANY PURPOSE, NOTIFY LAB WITHIN 5 DAYS.        LOWEST DETECTABLE LIMITS FOR URINE DRUG SCREEN Drug Class       Cutoff (ng/mL) Amphetamine      1000 Barbiturate      200 Benzodiazepine   546 Tricyclics       568 Opiates          300 Cocaine          300 THC              50     Current Facility-Administered Medications  Medication Dose Route Frequency Provider Last Rate Last Dose  . hydrOXYzine (ATARAX/VISTARIL) tablet 25 mg  25 mg Oral Q6H PRN Junius Creamer, NP   25 mg at 04/01/16 1275   Current Outpatient Prescriptions  Medication Sig Dispense Refill  . aspirin 325 MG tablet Take 325 mg by mouth daily.    . cetirizine (ZYRTEC) 10 MG tablet Take 10 mg by mouth daily as needed for allergies.    . diphenhydrAMINE (BENADRYL) 25 mg capsule Take 25 mg by mouth every 6 (six) hours as needed for allergies (or anxiety).    . hydrOXYzine (ATARAX/VISTARIL) 25 MG tablet Take 1 tablet (25 mg total) by mouth every 6 (six) hours as needed for anxiety. 30 tablet 0  . clonazePAM (KLONOPIN) 1 MG tablet Take 1 mg by mouth 4 (four) times daily.    . montelukast (SINGULAIR) 10 MG tablet Take 10 mg by mouth at  bedtime.    . potassium chloride (K-DUR,KLOR-CON) 10 MEQ tablet Take 10 mEq by mouth every 12 (twelve) hours.    Marland Kitchen QUEtiapine (SEROQUEL) 50 MG tablet Take 50 mg by mouth See admin instructions. Take 50 mg by mouth twice daily for one day on 03/27/16 then 100 mg twice daily on 03/28/16 then 100 mg three times daily thereafter    . risperiDONE (RISPERDAL) 1 MG tablet Take 1 mg by mouth at bedtime.    . simvastatin (ZOCOR) 20 MG tablet Take 20 mg by mouth daily.    Marland Kitchen triamterene-hydrochlorothiazide (MAXZIDE-25) 37.5-25 MG tablet Take 1 tablet by mouth daily.     Psychiatric Specialty Exam: Physical Exam: See ED assessment notes  ROS: See ED assessment notes  Blood pressure 120/78, pulse 99, temperature 98.7 F (37.1 C), temperature source Oral, resp. rate 18, height 5' 3"  (1.6 m), weight 57.607 kg (127 lb), SpO2 100 %.Body mass index is 22.5 kg/(m^2).  General Appearance: Disheveled and tremulous  Eye Contact:  Fair  Speech:  Garbled and Slow  Volume:  Decreased  Mood:  Anxious and Depressed  Affect:  Flat  Thought Process:  Goal Directed  Orientation:  Other:  Oriented to self, person & place, disoriented to time.  Thought Content:  Rumination, denies any hallucinations, delusions or paranoia.  Suicidal Thoughts:  Denies  Homicidal Thoughts:  Denies  Memory:  Immediate;   Fair Recent;   Fair Remote;   Poor  Judgement:  Fair  Insight:  Fair  Psychomotor Activity:  Tremor, reports high anxiety levels  Concentration:  Concentration: Fair and Attention Span: Fair  Recall:  Poor  Fund of Knowledge:  Fair  Language:  Fair  Akathisia:  Has some visible hand tremors  Handed:  Right  AIMS (if indicated):     Assets:  Desire for Improvement  ADL's:  Impaired  Cognition:  WNL  Sleep:      Treatment Plan Summary: Daily contact with patient to assess and evaluate symptoms and progress in treatment  Disposition: Recommend psychiatric Inpatient admission when medically cleared. (Sister  states per telephone that there has been a significant change in patient mental & physical health over the last few days. She states that it stated with increased confusion, tremors & unable to walk). Per report from sister & patient, she does have hx of anxiety disorder of which she has been treated with Klonopin, Valium & Xanax.  Encarnacion Slates, NP, PMHNP, FNP-BC 04/01/2016 4:35 PM
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
## 2016-04-01 NOTE — ED Notes (Signed)
Patients family request to be updated at [phone] and [phone], [name] and Wilson SingerSteve Bunch

## 2016-04-01 NOTE — ED Notes (Signed)
Family at bedside talking with social worker

## 2016-04-02 LAB — CBG MONITORING, ED: Glucose-Capillary: 100 mg/dL — ABNORMAL HIGH (ref 65–99)

## 2016-04-02 NOTE — ED Notes (Signed)
[facility], pt resting on bed, pt is confuse, mostly no verbal, pt assessed by Dr. Norma Fredricksonny, NAD noticed, CBG check with a level of 100, breakfast ordered. Pt had one episode of urine incontinence, pt changed and bed sheets changed. Rails up, yellow socks on, call bell at reach of the pt. Bedside report given with Redmond BasemanHayden.
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
## 2016-04-02 NOTE — ED Notes (Signed)
Gave pt Coke and applesauce, per Millie - RN.

## 2016-04-02 NOTE — ED Notes (Signed)
Offered to place order for pt dinner tray, pt. Continues to decline.

## 2016-04-02 NOTE — ED Notes (Signed)
Pt. Sitting on side of bed. Pt. Assisted back into bed by this RN. Pt. Having religious hallucinations.

## 2016-04-02 NOTE — ED Notes (Signed)
Called for sitter due to pt. Attempts to get up and disorientation.

## 2016-04-02 NOTE — ED Notes (Signed)
Pt. Resting comfortably in bed with eyes closed, rise and fall of chest noted, unlabored and even respirations.

## 2016-04-02 NOTE — ED Notes (Addendum)
Pt. Awoke by this RN and NT Debbie. Pt. Offered assistance with breakfast tray, Pt. Stating she does not want to eat. Pt. Sister called and provided number to talk with pt. Pt. Stating she doesn't feel like she can talk to her at this time. Pt. Sister states she will be here later this AM.

## 2016-04-02 NOTE — ED Notes (Signed)
Pt. Family at bedside.

## 2016-04-02 NOTE — ED Notes (Signed)
Offered to order pt. Dinner tray, Pt. Declined at this time.

## 2016-04-02 NOTE — Evaluation (Signed)
Physical Therapy Evaluation Patient Details Name: Julie Curry MRN: [id number] DOB: Jun 02, 1951 Today's Date: 04/02/2016   History of Present Illness  65 y.o. female patient admitted with complaints of severe anxiety levels, tremors & disorientation  Clinical Impression  Patient seen for mobility evaluation and assessment. Patient evaluation limited secondary to arousal and cognitive participation. Performed sternal rub to arouse patient then assisted patient to EOB. Patient with some minimal verbal perseveration and poor ability to engage in activity. Patient was able to sit self supported and did demonstrate active movement in all extremities but would not engage in functional tasks such as standing or ambulation at this time. Assisted patient back to bed and positioned comfortable. At this time, question if limitations in function are related to physical vs behavioral. Will trial PT while in hospital setting, anticipate patient for d/c to psych facility. Will continue to see as indicated and progress as tolerated.    Follow Up Recommendations Other (comment) (inpatient psych)    Equipment Recommendations  Other (comment) (TBD)    Recommendations for Other Services       Precautions / Restrictions Precautions Precautions: Fall      Mobility  Bed Mobility Overal bed mobility: Needs Assistance Bed Mobility: Rolling;Sidelying to Sit;Sit to Sidelying Rolling: Max assist Sidelying to sit: Total assist     Sit to sidelying: Total assist General bed mobility comments: max to total assist for bed mobility secondary to arousal level and cognition. At this time, patient with poor ability to participate with therapies.  Transfers                    Ambulation/Gait             General Gait Details: not assessed dur to lethargy and inability to engage for extended time  Stairs            Wheelchair Mobility    Modified Rankin (Stroke Patients Only)        Balance Overall balance assessment: Needs assistance   Sitting balance-Leahy Scale: Fair Sitting balance - Comments: able to sit EOB upsupported for breif periods when aroused                                     Pertinent Vitals/Pain Pain Assessment: Faces Faces Pain Scale: Hurts a little bit Pain Location: unknown Pain Descriptors / Indicators: Grimacing    Home Living Family/patient expects to be discharged to:: Unsure                      Prior Function Level of Independence: Independent               Hand Dominance        Extremity/Trunk Assessment   Upper Extremity Assessment: Generalized weakness           Lower Extremity Assessment: Generalized weakness         Communication      Cognition Arousal/Alertness: Lethargic Behavior During Therapy: Flat affect Overall Cognitive Status: No family/caregiver present to determine baseline cognitive functioning Area of Impairment: Attention;Following commands;Safety/judgement   Current Attention Level: Focused   Following Commands: Follows one step commands inconsistently;Follows one step commands with increased time Safety/Judgement: Decreased awareness of safety;Decreased awareness of deficits     General Comments: patient difficulty to engage and arouse during session. Once sitting EOB patient with some arousal and ability to maintain sitting  but otherwise not following consistent commands and patient verbally perseverating intermittently    General Comments      Exercises        Assessment/Plan    PT Assessment Patient needs continued PT services  PT Diagnosis Difficulty walking;Altered mental status   PT Problem List Decreased activity tolerance;Decreased balance;Decreased mobility;Decreased cognition;Decreased safety awareness  PT Treatment Interventions Gait training;Functional mobility training;Therapeutic activities;Therapeutic exercise;Balance training;Cognitive  remediation;Patient/family education   PT Goals (Current goals can be found in the Care Plan section) Acute Rehab PT Goals PT Goal Formulation: Patient unable to participate in goal setting Time For Goal Achievement: 04/16/16 Potential to Achieve Goals: Fair    Frequency Min 2X/week   Barriers to discharge        Co-evaluation               End of Session   Activity Tolerance: Patient limited by fatigue;Patient limited by lethargy Patient left: in bed Nurse Communication: Mobility status         Time: [id number] PT Time Calculation (min) (ACUTE ONLY): 17 min   Charges:   PT Evaluation $PT Eval Moderate Complexity: 1 Procedure     PT G CodesFabio Asa:        Suraya Vidrine J 04/02/2016, 9:51 AM Charlotte Crumbevon Shatoya Roets, PT DPT  [phone]

## 2016-04-02 NOTE — ED Notes (Signed)
Pt. Sitting on side of bed. Pt. Assisted back to bed by this RN. Pt. Lights turned down. Pt. Requesting for this RN to leave her alone. Pt. Calm and cooperative.

## 2016-04-02 NOTE — ED Notes (Signed)
Pt. Requesting that family leaves. Pt. Speaking in low, pressured speech. Stating that she "loves her family but they need to leave for their safety."

## 2016-04-02 NOTE — ED Notes (Signed)
Ordered pt. Lunch tray.  

## 2016-04-02 NOTE — ED Notes (Signed)
Pt. Linens changed and peri-care performed with PT staff.

## 2016-04-02 NOTE — [facility] Counselor (Signed)
Julie Curry at [facility] reports they have one female bed available. Writer faxed referral. Julie [facility] at [facility] reports they have tentative female gero [place] available today [place]. Writer faxed referral.  Julie Curry at [facility] reports they have bed. Writer faxed referral.  [facility] reports they have beds. Writer faxed referral.   Facilities at capacity: Julie Curry - Per Julie Curry, not taking referrals.    Julie Cristalaroline Paige Tyresha Fede, [place] Therapeutic Triage Specialist

## 2016-04-02 NOTE — [facility] Counselor (Signed)
Delorise ShinerGrace at [facility] reports they have one female bed available. Writer faxed referral. Alyssa at [facility] reports they have tentative female gero med available today [place]. Writer faxed referral.  Selena BattenKim at [facility] reports they have open beds. Writer faxed referral.  Jamesetta SoPhyllis at [facility] reports they have open beds. Writer faxed referral. Jill AlexandersJustin at [facility] reports they do have beds for pt's over 65 yo, Clinical research associatewriter faxed referral.  Cordelia PenSherry at [facility] reports they are at capacity and not accepting referrals.  Rosalita ChessmanSuzanne at [facility] reports no available beds and not accepting referrals.   Evette Cristalaroline Paige Claxton Levitz, [place] Therapeutic Triage Specialist

## 2016-04-02 NOTE — ED Notes (Signed)
Notified [facility] of pt. Increased religiosity and paranoid delusions.

## 2016-04-03 MED ORDER — ONDANSETRON 4 MG PO TBDP
4.0000 mg | ORAL_TABLET | ORAL | Status: AC
  Administered 2016-04-03: 4 mg via ORAL
  Filled 2016-04-03: qty 1

## 2016-04-03 MED ORDER — ACETAMINOPHEN 325 MG PO TABS
650.0000 mg | ORAL_TABLET | ORAL | Status: DC | PRN
  Filled 2016-04-03: qty 2

## 2016-04-03 MED ORDER — ZOLPIDEM TARTRATE 5 MG PO TABS: 5.0000 mg | ORAL_TABLET | Freq: Every evening | ORAL | Status: DC | PRN

## 2016-04-03 MED ORDER — DIPHENHYDRAMINE HCL 25 MG PO CAPS
25.0000 mg | ORAL_CAPSULE | Freq: Once | ORAL | Status: AC
  Administered 2016-04-03: 25 mg via ORAL
  Filled 2016-04-03: qty 1

## 2016-04-03 MED ORDER — IBUPROFEN 400 MG PO TABS
600.0000 mg | ORAL_TABLET | Freq: Three times a day (TID) | ORAL | Status: DC | PRN
  Administered 2016-04-03 – 2016-04-04 (×2): 600 mg via ORAL
  Filled 2016-04-03 (×2): qty 1

## 2016-04-03 MED ORDER — ALUM & MAG HYDROXIDE-SIMETH 200-200-20 MG/5ML PO SUSP
30.0000 mL | ORAL | Status: DC | PRN
  Administered 2016-04-04: 30 mL via ORAL
  Filled 2016-04-03: qty 30

## 2016-04-03 MED ORDER — ONDANSETRON HCL 4 MG PO TABS: 4.0000 mg | ORAL_TABLET | Freq: Three times a day (TID) | ORAL | Status: DC | PRN

## 2016-04-03 NOTE — ED Notes (Signed)
Offered pt snack - advised she has 1 cup of apple sauce, graham crackers and peanut butter left over from 1000 snack.

## 2016-04-03 NOTE — ED Notes (Signed)
Will obtain VS's when awakens.

## 2016-04-03 NOTE — ED Notes (Signed)
Pt given graham crackers, peanut butter, apple sauce and water for snack as requested. Sitter attempting to obtain pt's lunch order. Pt noted to be indecisive.

## 2016-04-03 NOTE — ED Notes (Signed)
Pt talking on phone at nurses' desk w/her sister. Pt noted to be pulling phone away from her ear. When hung up, states her sister was "talking at me". Pt returned to room w/sitter.

## 2016-04-03 NOTE — ED Notes (Signed)
Sitting on side of bed talking w/sitter.

## 2016-04-03 NOTE — ED Notes (Signed)
Pt sitting on side of bed eating breakfast and talking w/sitter.

## 2016-04-03 NOTE — ED Notes (Addendum)
Heat packs x 2 given to pt for c/o back pain d/t states back felt better when she was in the shower allowing the warm water to flow over her back. Pt noted to continue to be talkative. Pt given Atarax as requested for her nerves.

## 2016-04-03 NOTE — ED Notes (Signed)
Pt sitting on bed w/right pants leg raised, touching her leg w/her hand and talking to herself.

## 2016-04-03 NOTE — ED Notes (Signed)
Pt lying in bed talking w/sitter.

## 2016-04-03 NOTE — ED Notes (Signed)
Pt eating apple sauce.

## 2016-04-03 NOTE — ED Notes (Signed)
Pt's sister and spouse arrived to visit w/pt. Pt noted to be sleeping. Sister advised she will call tonight to inquire about pt.

## 2016-04-04 NOTE — ED Notes (Signed)
Pt called sister and asked her to come visit. Sister advised she will come at 1730 visitation time.

## 2016-04-04 NOTE — ED Notes (Signed)
Pt's sister and brother-in-law visiting w/pt.

## 2016-04-04 NOTE — ED Notes (Signed)
Pt now awake - pt requesting to eat breakfast. New tray on table. Pt also given ice pack d/t c/o eyes sore and heat packs for feet d/t chronic feet pain. Advised pt her sister had called checking on her and that she came to visit yesterday afternoon while she was sleeping. Pt smiled and stated she was glad she came to visit her.

## 2016-04-04 NOTE — ED Notes (Signed)
Spoke w/pt's sister Britta Mccreedy- Barbara. Advised her pt is requesting for her to visit. She advised it may be 1800 before she is able to arrive. Advised her this one-time visit outside of visitation time is OK d/t this RN had notified her earlier that pt was napping and she may need to wait and visit in am. GrenadaBrittany, Secretary 1st, aware.

## 2016-04-04 NOTE — ED Notes (Signed)
Pt aware her sister will be coming to visit.

## 2016-04-04 NOTE — ED Notes (Signed)
Pt requesting Tums d/t states cabbage she ate earlier is causing indigestion. Advised pt we have Maalox liquid. Pt initially declined then stated she wanted to take it. When attempting to administer, pt stated she thought she was being given chewable tablets. Advised pt x 2 only liquid available at this time. Voiced understanding then took 1/2 of med. Pt then stated she would take rest later and wanted to just sit and talk w/RN. Requested for pt to finish med at this time and RN would call her sister and request she come visit. Pt then took after much encouragement. Pt c/o itching to posterior neck - no redness or swelling noted. Pt applying lotion.

## 2016-04-04 NOTE — ED Notes (Signed)
Snack and drink provided. ?

## 2016-04-04 NOTE — ED Notes (Signed)
Transfer of care to sitter/NT Lonell FaceKim Dick

## 2016-04-04 NOTE — ED Notes (Signed)
Pt's sister called requesting to speak w/pt. Pt asked for RN to ask that she call back when the tv show she is watching ends. Sister voiced understanding.

## 2016-04-04 NOTE — ED Notes (Signed)
Pt c/o her back feeling itchy; no redness, wheals, urticaria noted to back. RN Kriste BasqueBecky made aware.

## 2016-04-04 NOTE — ED Notes (Signed)
Pt noted to be incont of urine. States she wants her sister to come give her a bed bath. Encouraged pt to shower and advised her she showered yesterday w/o any difficulty.

## 2016-04-04 NOTE — ED Notes (Signed)
Pt resting comfortably in bed. Periodically waking to watch tv show.

## 2016-04-04 NOTE — ED Notes (Signed)
Family visiting and sitting with patient

## 2016-04-04 NOTE — ED Notes (Signed)
Pt's sister, Britta MccreedyBarbara, called - advised her pt is sleeping. Requested for pt to be advised she called when she awakens.

## 2016-04-04 NOTE — ED Notes (Signed)
Pt initially refusing to shower. Walker obtained for pt and staff assisted her to shower and chair placed in shower for pt so may sit while showering. Pt's sister called and RN requested for her to let pt call her back. Voiced understanding.

## 2016-04-04 NOTE — ED Notes (Signed)
Atarax given as requested for c/o itching. Ibuprofen given for c/o chronic neck pain as requested. Pt states she will eat lunch later.

## 2016-04-04 NOTE — ED Notes (Signed)
Pt requested to have BP taken

## 2016-04-04 NOTE — ED Notes (Signed)
Pt sitting in chair in room eating lunch.

## 2016-04-04 NOTE — ED Notes (Signed)
Dinner meal tray provided.

## 2016-04-05 ENCOUNTER — Emergency Department (HOSPITAL_COMMUNITY): Payer: BLUE CROSS/BLUE SHIELD

## 2016-04-05 ENCOUNTER — Other Ambulatory Visit: Payer: Self-pay

## 2016-04-05 DIAGNOSIS — F333 Major depressive disorder, recurrent, severe with psychotic symptoms: Secondary | ICD-10-CM | POA: Diagnosis not present

## 2016-04-05 MED ORDER — SERTRALINE HCL 50 MG PO TABS: 25.0000 mg | ORAL_TABLET | Freq: Every day | ORAL | Status: DC

## 2016-04-05 MED ORDER — LORAZEPAM 1 MG PO TABS
1.0000 mg | ORAL_TABLET | Freq: Once | ORAL | Status: AC
  Administered 2016-04-05: 1 mg via ORAL
  Filled 2016-04-05: qty 1

## 2016-04-05 MED ORDER — SERTRALINE HCL 50 MG PO TABS
25.0000 mg | ORAL_TABLET | Freq: Every day | ORAL | Status: DC
  Administered 2016-04-05: 25 mg via ORAL
  Filled 2016-04-05: qty 1

## 2016-04-05 MED ORDER — RISPERIDONE 0.5 MG PO TABS
0.5000 mg | ORAL_TABLET | Freq: Every day | ORAL | Status: DC
Start: 2016-04-05 — End: 2016-04-06

## 2016-04-05 NOTE — ED Notes (Signed)
Contacted sheriffs department for transport [facility]

## 2016-04-05 NOTE — ED Notes (Signed)
Sitter present pt eating breakfast

## 2016-04-05 NOTE — ED Notes (Signed)
[facility] called to ask about bed status for pt explained situation.  [facility] doesn't accept pts after 11pm okay to send in the morning after 7am.

## 2016-04-05 NOTE — ED Notes (Signed)
Pt c/o of itching and skin irritation , pt medicated

## 2016-04-05 NOTE — ED Notes (Signed)
TTS finished 

## 2016-04-05 NOTE — Progress Notes (Signed)
[facility], by Dr. Tonita PhoenixKomissarova, number to call report is [phone]. Acceptance is pending pt be under IVC. Accepting MD states the facility "feels as if it would be unsafe/unethical to require pt apparently experiencing delusions to consent to treatment that she may or may not have the capacity to understand at this point." Informed [facility] CSW. Per ED, IVC paperwork in progress, awaiting delivery.  Updated Britta [facility]Barbara at [facility]. She asks that report be called once transport has arrived to bring pt [facility].  Ilean SkillMeghan Charleigh Correnti, MSW, LCSW Clinical Social Work, Disposition  04/05/2016 [phone]
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
## 2016-04-05 NOTE — ED Notes (Signed)
Sheriffs department arrived for transport 

## 2016-04-05 NOTE — ED Provider Notes (Signed)
[facility] Will transfer stable.   Samuel JesterKathleen Ryaan Vanwagoner, DO 04/05/16 1649

## 2016-04-05 NOTE — ED Notes (Signed)
Lunch delivered; pt eating

## 2016-04-05 NOTE — ED Notes (Signed)
Up to BR states will shower  In a while

## 2016-04-05 NOTE — ED Notes (Signed)
[facility] probably going [facility]. Pt now IVC papers faxed

## 2016-04-05 NOTE — Progress Notes (Signed)
IVC paperwork prepared.  NS to notarize and fax to [facility] [employer].

## 2016-04-05 NOTE — Progress Notes (Signed)
Spoke with pt's sister [name] [phone] (listed as emergency contact for pt). Ms. Liam RogersBunch states pt has been living in [place] for several years, recently sold her house and was staying in hotel. Moved to [place] to "look into staying here since our family is around here." States pt has "no family in [place] and she is unaware of close friends who may know more about how she has been before she moved here." States pt has been staying in hotel near sister. Ms. Liam RogersBunch states she is unsure of details of pt's psychiatric hx. Notes that she "has been on Klonopin for several years, and was prescribed Seroquel at one point." States pt abruptly stopped Klonopin when she moved to [place] last month. Saw "a psychiatrist one time when she first got here, but they prescribed her something different." Ms. Liam RogersBunch states pt "got food poisoning about a month ago and lost a lot of weight." She states that, about a week ago, pt started noticeable declining in both mood and behavior. States, "She got very shaky, said she couldn't walk or talk, was afraid to leave home to go to appointments, was very stuck on how she had had food poisoning. She stopped showering and sleeping." Sister states she has not seen pt display these symptoms before. States "we took her to urgent care and the doctor said she seemed manic, her gave her Seroquel. A couple of days later, things were not any better, and we took her to the ED because we were starting to get more concerned. She is content one minute and refuses to speak to us the next."Sister explains pt is independent with ADLs and is ambulatory, clarifies that, upon admission to ED, pt maintained that she could not walk or talk, but that was "no true, she seemed so confused."  Sister states she is unsure of any family hx of mental health issues, other than "thinking that our mother perhaps had bipolar, unsure if this was officially diagnosed."  Sister aware psychiatric inpatient treatment has been  recommended for pt due to having visited pt in ED multiple times and been updated by pt. Plans to visit today. Sister requests that she be updated when pt transferred, CSW explained this can take place provided pt continues to be willing to have family contacted. Ms. Liam RogersBunch expressed understanding and thanked staff for care of pt.  Pt referred to: [facility]- per Delorise ShinerGrace, pt's referral needs to include EKG and chest x-ray. Informed ED- tests in progress, faxed referral and will send results when available [facility]- per Britta Mccreedybarbara, one geriatric psych bed opening today, referral sent [facility]- referred pt for review for waiting list [facility]- per Windell Mouldinguth, referral received alst week, however, "I called the hospital this morning and was told this patient had already been transferred to another facility." informed Windell MouldingRuth this was inaccurate information, and refaxed referral with updated information. [facility] - per Jamesetta SoPhyllis, one geriatric psych bed opening today, sent referral [facility])- per Caryn BeeKevin same as above  [facility] and will refer if there is availability.   Ilean SkillMeghan Kenniya Westrich, MSW, LCSW Clinical Social Work, Disposition  04/05/2016 [phone]

## 2016-04-06 NOTE — ED Notes (Signed)
Left message for her sister, Britta MccreedyBarbara, to call - so may advise of acceptance and transport [facility].

## 2016-04-06 NOTE — ED Notes (Signed)
Pt noted to be in bathroom at least 10 min. Pt noted to be standing in front of mirror, brush on sink. States she wants to brush her hair. Encouraged pt to sit in w/c so may be escorted to deputy's [facility] for transport. Pt continued to stand in front of mirror then sat in w/c as was asked x 2. NT [facility]

## 2016-04-06 NOTE — ED Notes (Signed)
[facility] aware of need for [facility]. [facility]

## 2016-04-06 NOTE — ED Notes (Signed)
Atarax given for c/o generalized itching. No rash noted. [facility] and waiting on transport service. Pt states she does not want to go anywhere and that she wants to stay in ED.

## 2017-09-16 IMAGING — DX DG CHEST 2V
2 series · 2 of 2 positions shown · non-contrast
Comparison: None.

CLINICAL DATA: Three day history of cough.

EXAM:
CHEST  2 VIEW

[w chest pa]
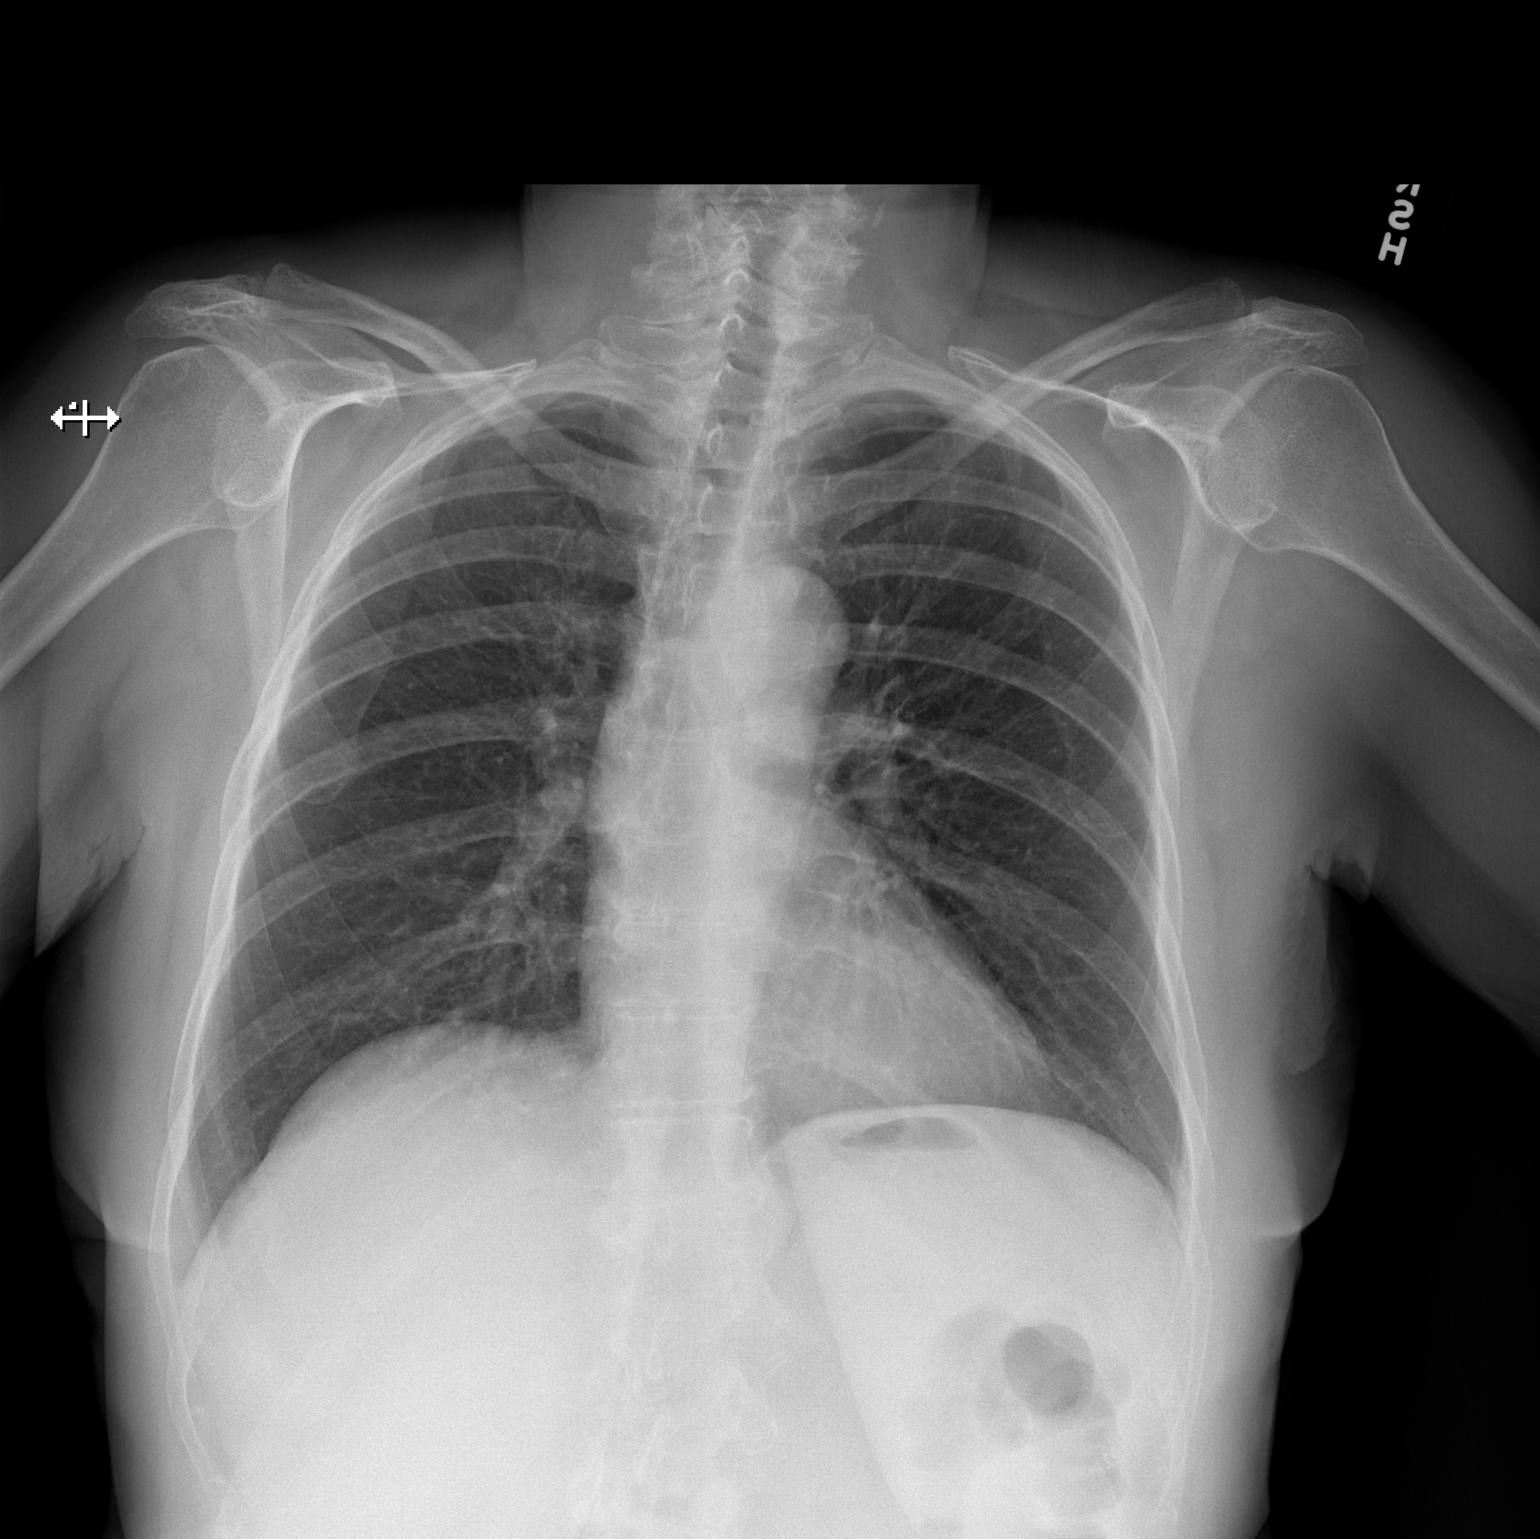

[w chest lat]
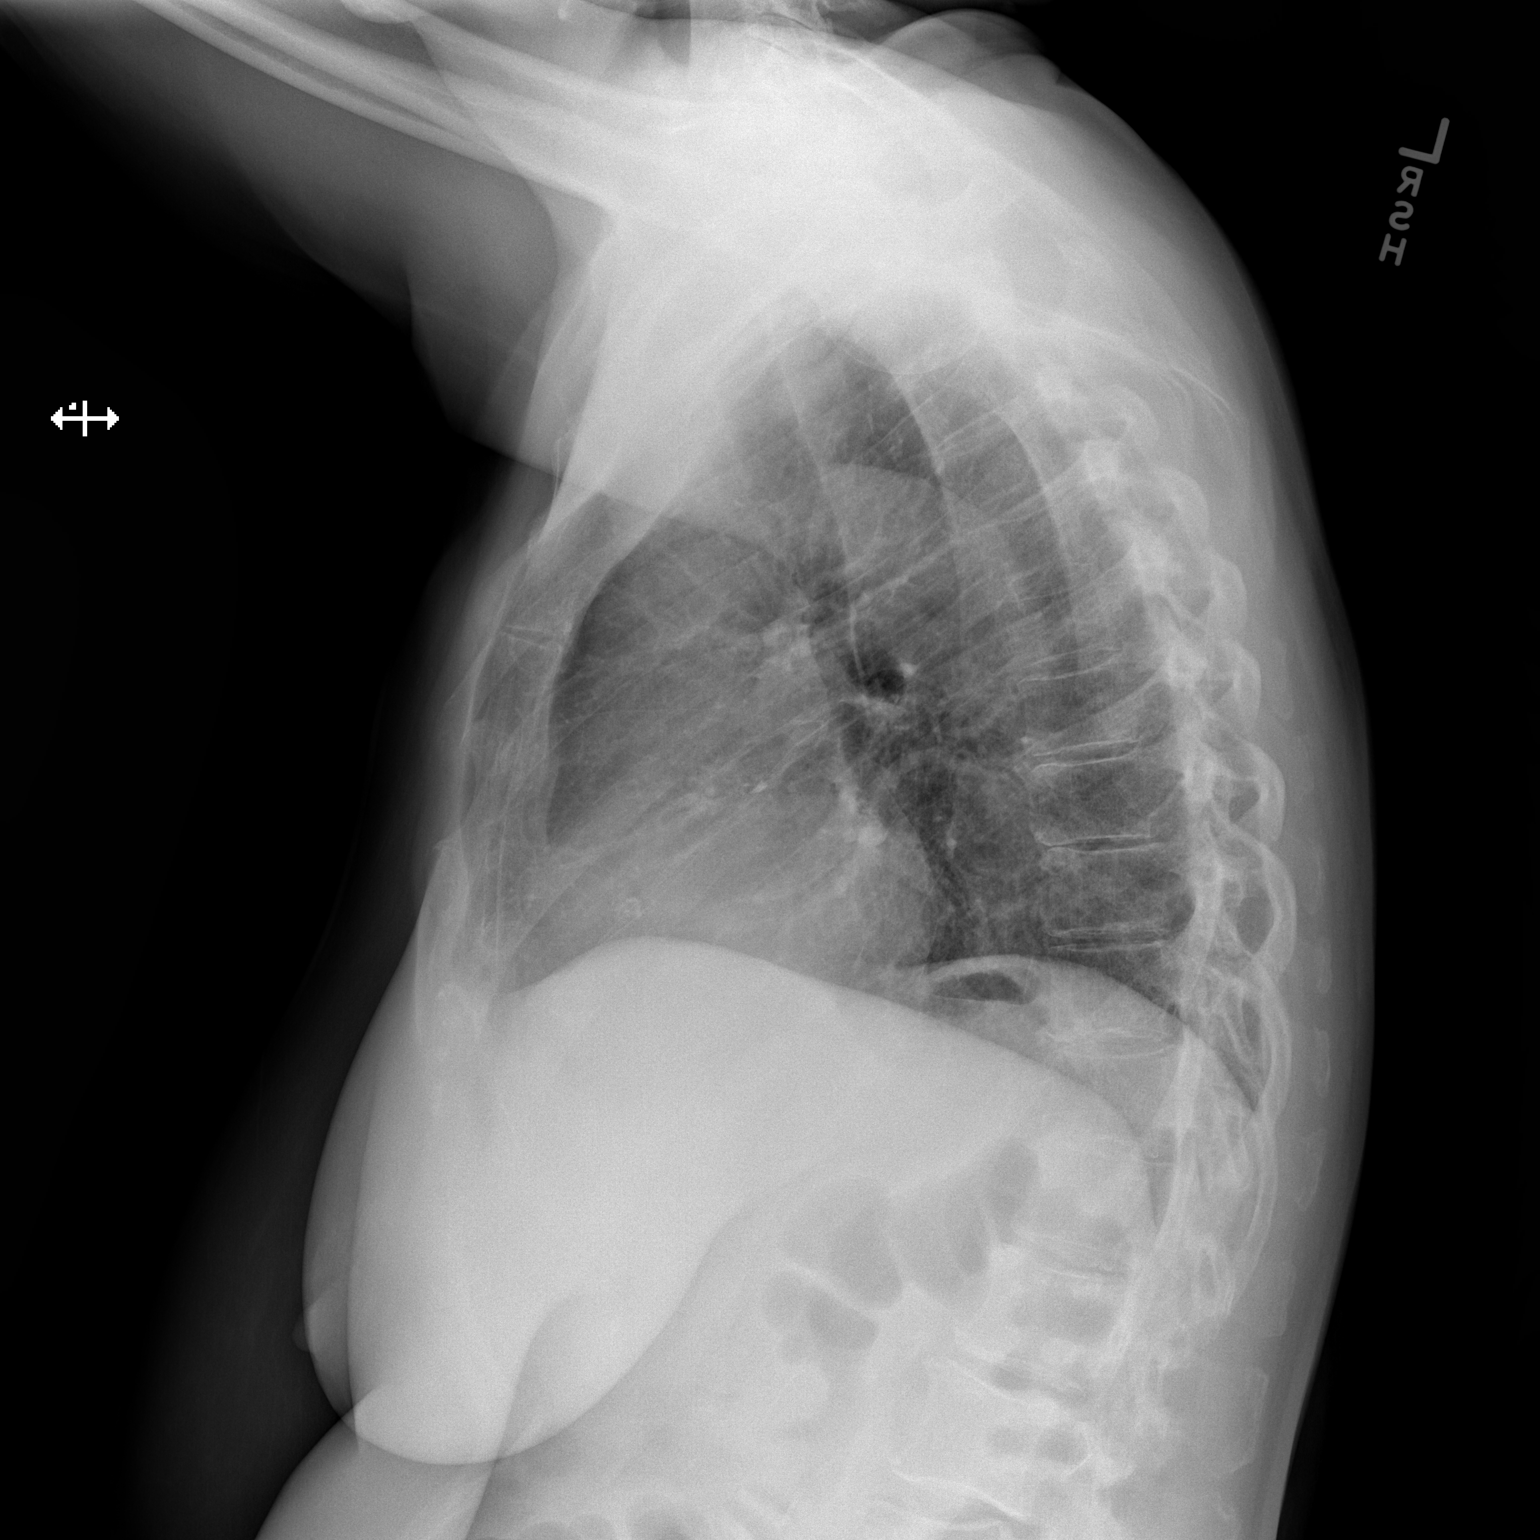

[2 of 2 positions shown; findings below may reference images not displayed]

FINDINGS: The lungs are clear wiithout focal pneumonia, edema, pneumothorax or
pleural effusion. The cardiopericardial silhouette is within normal
limits for size. The visualized bony structures of the thorax are
intact.
IMPRESSION: No active cardiopulmonary disease.

## 2017-12-20 ENCOUNTER — Other Ambulatory Visit: Payer: Self-pay | Admitting: Nurse Practitioner

## 2017-12-20 DIAGNOSIS — Z13820 Encounter for screening for osteoporosis: Secondary | ICD-10-CM | POA: Diagnosis not present

## 2017-12-20 DIAGNOSIS — Z139 Encounter for screening, unspecified: Secondary | ICD-10-CM

## 2017-12-20 DIAGNOSIS — Z1231 Encounter for screening mammogram for malignant neoplasm of breast: Secondary | ICD-10-CM | POA: Diagnosis not present

## 2017-12-20 DIAGNOSIS — I10 Essential (primary) hypertension: Secondary | ICD-10-CM | POA: Diagnosis not present

## 2017-12-20 DIAGNOSIS — R69 Illness, unspecified: Secondary | ICD-10-CM | POA: Diagnosis not present

## 2017-12-20 DIAGNOSIS — E7800 Pure hypercholesterolemia, unspecified: Secondary | ICD-10-CM | POA: Diagnosis not present

## 2017-12-23 ENCOUNTER — Ambulatory Visit: Payer: Self-pay

## 2018-01-04 ENCOUNTER — Ambulatory Visit: Payer: MEDICARE | Admitting: Podiatry

## 2018-01-25 ENCOUNTER — Encounter: Payer: MEDICARE | Admitting: Podiatry

## 2018-01-30 NOTE — Progress Notes (Signed)
This encounter was created in error - please disregard.

## 2018-05-31 DIAGNOSIS — Z1389 Encounter for screening for other disorder: Secondary | ICD-10-CM | POA: Diagnosis not present

## 2018-05-31 DIAGNOSIS — M25561 Pain in right knee: Secondary | ICD-10-CM | POA: Diagnosis not present

## 2018-05-31 DIAGNOSIS — R69 Illness, unspecified: Secondary | ICD-10-CM | POA: Diagnosis not present

## 2018-05-31 DIAGNOSIS — M5440 Lumbago with sciatica, unspecified side: Secondary | ICD-10-CM | POA: Diagnosis not present

## 2018-05-31 DIAGNOSIS — M25562 Pain in left knee: Secondary | ICD-10-CM | POA: Diagnosis not present

## 2018-05-31 DIAGNOSIS — R600 Localized edema: Secondary | ICD-10-CM | POA: Diagnosis not present

## 2018-05-31 DIAGNOSIS — Z0000 Encounter for general adult medical examination without abnormal findings: Secondary | ICD-10-CM | POA: Diagnosis not present

## 2018-05-31 DIAGNOSIS — I10 Essential (primary) hypertension: Secondary | ICD-10-CM | POA: Diagnosis not present

## 2018-05-31 DIAGNOSIS — S060X1A Concussion with loss of consciousness of 30 minutes or less, initial encounter: Secondary | ICD-10-CM | POA: Diagnosis not present

## 2018-05-31 DIAGNOSIS — G4700 Insomnia, unspecified: Secondary | ICD-10-CM | POA: Diagnosis not present

## 2018-06-12 DIAGNOSIS — R42 Dizziness and giddiness: Secondary | ICD-10-CM | POA: Diagnosis not present

## 2018-06-12 DIAGNOSIS — S0990XA Unspecified injury of head, initial encounter: Secondary | ICD-10-CM | POA: Diagnosis not present

## 2018-06-12 DIAGNOSIS — W109XXA Fall (on) (from) unspecified stairs and steps, initial encounter: Secondary | ICD-10-CM | POA: Diagnosis not present

## 2018-06-12 DIAGNOSIS — W108XXA Fall (on) (from) other stairs and steps, initial encounter: Secondary | ICD-10-CM | POA: Diagnosis not present

## 2018-06-12 DIAGNOSIS — S4991XA Unspecified injury of right shoulder and upper arm, initial encounter: Secondary | ICD-10-CM | POA: Diagnosis not present

## 2018-06-12 DIAGNOSIS — M25511 Pain in right shoulder: Secondary | ICD-10-CM | POA: Diagnosis not present

## 2018-06-12 DIAGNOSIS — H81399 Other peripheral vertigo, unspecified ear: Secondary | ICD-10-CM | POA: Diagnosis not present

## 2018-06-12 DIAGNOSIS — R52 Pain, unspecified: Secondary | ICD-10-CM | POA: Diagnosis not present

## 2018-06-12 DIAGNOSIS — Z743 Need for continuous supervision: Secondary | ICD-10-CM | POA: Diagnosis not present

## 2018-07-10 DIAGNOSIS — R600 Localized edema: Secondary | ICD-10-CM | POA: Diagnosis not present

## 2018-07-10 DIAGNOSIS — M25552 Pain in left hip: Secondary | ICD-10-CM | POA: Diagnosis not present

## 2018-07-10 DIAGNOSIS — Z043 Encounter for examination and observation following other accident: Secondary | ICD-10-CM | POA: Diagnosis not present

## 2018-07-10 DIAGNOSIS — M25461 Effusion, right knee: Secondary | ICD-10-CM | POA: Diagnosis not present

## 2018-07-10 DIAGNOSIS — M25462 Effusion, left knee: Secondary | ICD-10-CM | POA: Diagnosis not present

## 2018-07-10 DIAGNOSIS — M25469 Effusion, unspecified knee: Secondary | ICD-10-CM | POA: Diagnosis not present

## 2018-11-02 DIAGNOSIS — R05 Cough: Secondary | ICD-10-CM | POA: Diagnosis not present

## 2018-11-02 DIAGNOSIS — H9201 Otalgia, right ear: Secondary | ICD-10-CM | POA: Diagnosis not present

## 2018-11-02 DIAGNOSIS — R51 Headache: Secondary | ICD-10-CM | POA: Diagnosis not present

## 2018-11-02 DIAGNOSIS — R062 Wheezing: Secondary | ICD-10-CM | POA: Diagnosis not present

## 2018-11-02 DIAGNOSIS — R42 Dizziness and giddiness: Secondary | ICD-10-CM | POA: Diagnosis not present

## 2018-11-02 DIAGNOSIS — J189 Pneumonia, unspecified organism: Secondary | ICD-10-CM | POA: Diagnosis not present

## 2018-11-03 DIAGNOSIS — J1008 Influenza due to other identified influenza virus with other specified pneumonia: Secondary | ICD-10-CM | POA: Diagnosis not present

## 2018-11-03 DIAGNOSIS — H9201 Otalgia, right ear: Secondary | ICD-10-CM | POA: Diagnosis not present

## 2018-11-03 DIAGNOSIS — R69 Illness, unspecified: Secondary | ICD-10-CM | POA: Diagnosis not present

## 2018-11-03 DIAGNOSIS — R42 Dizziness and giddiness: Secondary | ICD-10-CM | POA: Diagnosis not present

## 2018-11-03 DIAGNOSIS — J159 Unspecified bacterial pneumonia: Secondary | ICD-10-CM | POA: Diagnosis not present

## 2018-11-03 DIAGNOSIS — B9689 Other specified bacterial agents as the cause of diseases classified elsewhere: Secondary | ICD-10-CM | POA: Diagnosis not present

## 2018-11-03 DIAGNOSIS — Z590 Homelessness: Secondary | ICD-10-CM | POA: Diagnosis not present

## 2018-11-03 DIAGNOSIS — J189 Pneumonia, unspecified organism: Secondary | ICD-10-CM | POA: Diagnosis not present

## 2018-11-03 DIAGNOSIS — J441 Chronic obstructive pulmonary disease with (acute) exacerbation: Secondary | ICD-10-CM | POA: Diagnosis not present

## 2018-11-03 DIAGNOSIS — R51 Headache: Secondary | ICD-10-CM | POA: Diagnosis not present

## 2018-11-03 DIAGNOSIS — N3000 Acute cystitis without hematuria: Secondary | ICD-10-CM | POA: Diagnosis not present

## 2018-11-03 DIAGNOSIS — R062 Wheezing: Secondary | ICD-10-CM | POA: Diagnosis not present

## 2018-11-03 DIAGNOSIS — E785 Hyperlipidemia, unspecified: Secondary | ICD-10-CM | POA: Diagnosis not present

## 2018-11-03 DIAGNOSIS — R05 Cough: Secondary | ICD-10-CM | POA: Diagnosis not present

## 2018-11-03 DIAGNOSIS — J440 Chronic obstructive pulmonary disease with acute lower respiratory infection: Secondary | ICD-10-CM | POA: Diagnosis not present

## 2019-01-09 DIAGNOSIS — R69 Illness, unspecified: Secondary | ICD-10-CM | POA: Diagnosis not present

## 2019-01-09 DIAGNOSIS — M79604 Pain in right leg: Secondary | ICD-10-CM | POA: Diagnosis not present

## 2019-01-09 DIAGNOSIS — B86 Scabies: Secondary | ICD-10-CM | POA: Diagnosis not present

## 2019-01-09 DIAGNOSIS — I10 Essential (primary) hypertension: Secondary | ICD-10-CM | POA: Diagnosis not present

## 2019-04-22 DIAGNOSIS — E876 Hypokalemia: Secondary | ICD-10-CM | POA: Diagnosis not present

## 2019-04-22 DIAGNOSIS — Z79899 Other long term (current) drug therapy: Secondary | ICD-10-CM | POA: Diagnosis not present

## 2019-04-22 DIAGNOSIS — I10 Essential (primary) hypertension: Secondary | ICD-10-CM | POA: Diagnosis not present

## 2019-04-22 DIAGNOSIS — L299 Pruritus, unspecified: Secondary | ICD-10-CM | POA: Diagnosis not present

## 2019-04-22 DIAGNOSIS — R69 Illness, unspecified: Secondary | ICD-10-CM | POA: Diagnosis not present

## 2019-04-22 DIAGNOSIS — R000 Tachycardia, unspecified: Secondary | ICD-10-CM | POA: Diagnosis not present

## 2019-04-22 DIAGNOSIS — E785 Hyperlipidemia, unspecified: Secondary | ICD-10-CM | POA: Diagnosis not present

## 2019-04-22 DIAGNOSIS — R21 Rash and other nonspecific skin eruption: Secondary | ICD-10-CM | POA: Diagnosis not present

## 2019-04-22 DIAGNOSIS — B852 Pediculosis, unspecified: Secondary | ICD-10-CM | POA: Diagnosis not present

## 2019-04-22 DIAGNOSIS — N3000 Acute cystitis without hematuria: Secondary | ICD-10-CM | POA: Diagnosis not present

## 2019-05-16 DIAGNOSIS — B86 Scabies: Secondary | ICD-10-CM | POA: Diagnosis not present
